# Patient Record
Sex: Female | Born: 1950 | Race: White | Hispanic: No | Marital: Married | Smoking: Former smoker
Health system: Southern US, Community
[De-identification: ages and names within clinical notes are randomized; demographics above are authoritative.]

## PROBLEM LIST (undated history)

## (undated) DIAGNOSIS — F419 Anxiety disorder, unspecified: Secondary | ICD-10-CM

## (undated) DIAGNOSIS — E785 Hyperlipidemia, unspecified: Secondary | ICD-10-CM

## (undated) DIAGNOSIS — I10 Essential (primary) hypertension: Secondary | ICD-10-CM

---

## 1992-12-30 HISTORY — PX: REDUCTION MAMMAPLASTY: SUR839

## 1998-09-12 ENCOUNTER — Ambulatory Visit (HOSPITAL_BASED_OUTPATIENT_CLINIC_OR_DEPARTMENT_OTHER): Admission: RE | Admit: 1998-09-12 | Discharge: 1998-09-12 | Payer: Self-pay | Admitting: General Surgery

## 1999-10-10 ENCOUNTER — Encounter: Payer: Self-pay | Admitting: Family Medicine

## 1999-10-10 ENCOUNTER — Ambulatory Visit (HOSPITAL_COMMUNITY): Admission: RE | Admit: 1999-10-10 | Discharge: 1999-10-10 | Payer: Self-pay | Admitting: Family Medicine

## 2001-09-18 ENCOUNTER — Ambulatory Visit (HOSPITAL_COMMUNITY): Admission: RE | Admit: 2001-09-18 | Discharge: 2001-09-18 | Payer: Self-pay | Admitting: Neurosurgery

## 2001-09-18 ENCOUNTER — Encounter: Payer: Self-pay | Admitting: Neurosurgery

## 2004-11-13 ENCOUNTER — Ambulatory Visit: Payer: Self-pay | Admitting: Family Medicine

## 2005-12-25 ENCOUNTER — Ambulatory Visit: Payer: Self-pay | Admitting: Family Medicine

## 2007-02-24 ENCOUNTER — Ambulatory Visit: Payer: Self-pay | Admitting: Family Medicine

## 2008-04-28 ENCOUNTER — Ambulatory Visit: Payer: Self-pay | Admitting: Family Medicine

## 2009-05-09 ENCOUNTER — Ambulatory Visit: Payer: Self-pay | Admitting: Family Medicine

## 2009-07-10 ENCOUNTER — Ambulatory Visit: Payer: Self-pay | Admitting: Gastroenterology

## 2011-07-12 ENCOUNTER — Emergency Department: Payer: Self-pay | Admitting: Emergency Medicine

## 2011-08-01 ENCOUNTER — Ambulatory Visit: Payer: Self-pay | Admitting: Physical Medicine and Rehabilitation

## 2014-07-20 ENCOUNTER — Ambulatory Visit: Payer: Self-pay | Admitting: Family Medicine

## 2015-05-25 ENCOUNTER — Other Ambulatory Visit: Payer: Self-pay | Admitting: Family Medicine

## 2015-05-25 DIAGNOSIS — Z1231 Encounter for screening mammogram for malignant neoplasm of breast: Secondary | ICD-10-CM

## 2015-07-24 ENCOUNTER — Ambulatory Visit: Payer: Self-pay | Attending: Family Medicine

## 2016-06-14 ENCOUNTER — Ambulatory Visit: Payer: Self-pay

## 2016-06-19 ENCOUNTER — Ambulatory Visit: Payer: Self-pay | Attending: Family Medicine

## 2016-07-18 ENCOUNTER — Other Ambulatory Visit: Payer: Self-pay | Admitting: Family Medicine

## 2016-07-18 ENCOUNTER — Ambulatory Visit
Admission: RE | Admit: 2016-07-18 | Discharge: 2016-07-18 | Disposition: A | Discharge status: Home / Self Care | Payer: 59 | Source: Ambulatory Visit | Attending: Family Medicine | Admitting: Family Medicine

## 2016-07-18 DIAGNOSIS — Z1231 Encounter for screening mammogram for malignant neoplasm of breast: Secondary | ICD-10-CM | POA: Insufficient documentation

## 2017-12-01 ENCOUNTER — Other Ambulatory Visit: Payer: Self-pay | Admitting: Gynecologic Oncology

## 2018-07-23 ENCOUNTER — Emergency Department
Admission: EM | Admit: 2018-07-23 | Discharge: 2018-07-23 | Disposition: A | Discharge status: Home / Self Care | Payer: Medicare HMO | Attending: Emergency Medicine | Admitting: Emergency Medicine

## 2018-07-23 ENCOUNTER — Other Ambulatory Visit: Payer: Self-pay

## 2018-07-23 DIAGNOSIS — I10 Essential (primary) hypertension: Secondary | ICD-10-CM | POA: Insufficient documentation

## 2018-07-23 DIAGNOSIS — R9431 Abnormal electrocardiogram [ECG] [EKG]: Secondary | ICD-10-CM

## 2018-07-23 DIAGNOSIS — E876 Hypokalemia: Secondary | ICD-10-CM | POA: Insufficient documentation

## 2018-07-23 DIAGNOSIS — R110 Nausea: Secondary | ICD-10-CM

## 2018-07-23 HISTORY — DX: Essential (primary) hypertension: I10

## 2018-07-23 HISTORY — DX: Hyperlipidemia, unspecified: E78.5

## 2018-07-23 LAB — COMPREHENSIVE METABOLIC PANEL
ALBUMIN: 4.5 g/dL (ref 3.5–5.0)
ALT: 16 U/L (ref 0–44)
ANION GAP: 12 (ref 5–15)
AST: 24 U/L (ref 15–41)
Alkaline Phosphatase: 67 U/L (ref 38–126)
BUN: 13 mg/dL (ref 8–23)
CHLORIDE: 100 mmol/L (ref 98–111)
CO2: 29 mmol/L (ref 22–32)
Calcium: 9.5 mg/dL (ref 8.9–10.3)
Creatinine, Ser: 0.69 mg/dL (ref 0.44–1.00)
GFR calc Af Amer: >60 mL/min (ref >60)
GLUCOSE: 167 mg/dL — AB (ref 70–99)
POTASSIUM: 2.5 mmol/L — AB (ref 3.5–5.1)
Sodium: 141 mmol/L (ref 135–145)
Total Bilirubin: 0.6 mg/dL (ref 0.3–1.2)
Total Protein: 8.1 g/dL (ref 6.5–8.1)

## 2018-07-23 LAB — CBC
HEMATOCRIT: 41.9 % (ref 35.0–47.0)
HEMOGLOBIN: 14.8 g/dL (ref 12.0–16.0)
MCH: 32 pg (ref 26.0–34.0)
MCHC: 35.3 g/dL (ref 32.0–36.0)
MCV: 90.7 fL (ref 80.0–100.0)
Platelets: 354 10*3/uL (ref 150–440)
RBC: 4.62 MIL/uL (ref 3.80–5.20)
RDW: 13.6 % (ref 11.5–14.5)
WBC: 12.7 10*3/uL — AB (ref 3.6–11.0)

## 2018-07-23 LAB — LIPASE, BLOOD: LIPASE: 35 U/L (ref 11–51)

## 2018-07-23 LAB — TROPONIN I: Troponin I: <0.03 ng/mL (ref <0.03)

## 2018-07-23 LAB — CK: Total CK: 100 U/L (ref 38–234)

## 2018-07-23 MED ORDER — ONDANSETRON HCL 4 MG/2ML IJ SOLN
4.0000 mg | Freq: Once | INTRAMUSCULAR | Status: AC
  Administered 2018-07-23: 4 mg via INTRAVENOUS
  Filled 2018-07-23: qty 2

## 2018-07-23 MED ORDER — POTASSIUM CHLORIDE ER 10 MEQ PO TBCR: 40.0000 meq | EXTENDED_RELEASE_TABLET | Freq: Every day | ORAL | 0 refills | Status: DC

## 2018-07-23 MED ORDER — POTASSIUM CHLORIDE CRYS ER 20 MEQ PO TBCR
40.0000 meq | EXTENDED_RELEASE_TABLET | Freq: Once | ORAL | Status: AC
  Administered 2018-07-23: 40 meq via ORAL
  Filled 2018-07-23: qty 2

## 2018-07-23 MED ORDER — MAGNESIUM SULFATE 2 GM/50ML IV SOLN
2.0000 g | Freq: Once | INTRAVENOUS | Status: AC
  Administered 2018-07-23: 2 g via INTRAVENOUS
  Filled 2018-07-23: qty 50

## 2018-07-23 MED ORDER — SODIUM CHLORIDE 0.9 % IV BOLUS
1000.0000 mL | Freq: Once | INTRAVENOUS | Status: AC
  Administered 2018-07-23: 1000 mL via INTRAVENOUS

## 2018-07-23 NOTE — ED Notes (Signed)

## 2018-07-23 NOTE — ED Triage Notes (Signed)
First Nurse Note:  C/O N/V today.  Patient is concerned she is dehydrated.  AAOx3.  Skin warm and dry.  MM Moist.

## 2018-07-23 NOTE — ED Notes (Signed)
Writer attempted to draw blood. Pt would not be still, once needle was in pt jumped up and stated "I need to be in a bed when I give blood." pt is now in main waiting room.

## 2018-07-23 NOTE — ED Provider Notes (Signed)
[facility] Emergency Department Provider Note ____________________________________________   First MD Initiated Contact with Patient 07/23/18 1933     (approximate)  I have reviewed the triage vital signs and the nursing notes.   HISTORY  Chief Complaint Emesis  HPI Aldona LentoBrenda C Sessa is a 67 y.o. female with a history of hypertension hyperlipidemia was presenting with diffuse body aches as well as nausea over the past 12 hours.  She says that she has been working outside frequently lately in the heat.  Denies any chest pain or abdominal pain.  Past Medical History:  Diagnosis Date  . Hyperlipidemia   . Hypertension     There are no active problems to display for this patient.   Past Surgical History:  Procedure Laterality Date  . REDUCTION MAMMAPLASTY Bilateral 1994    Prior to Admission medications   Not on File    Allergies Patient has no known allergies.  Family History  Problem Relation Age of Onset  . Breast cancer Paternal Grandmother     Social History Social History   Tobacco Use  . Smoking status: Never Smoker  Substance Use Topics  . Alcohol use: Never    Frequency: Never  . Drug use: Not on file    Review of Systems  Constitutional: No fever/chills Eyes: No visual changes. ENT: No sore throat. Cardiovascular: Denies chest pain. Respiratory: Denies shortness of breath. Gastrointestinal: No abdominal pain.  no vomiting.  No diarrhea.  No constipation. Genitourinary: Negative for dysuria. Musculoskeletal: The aches as above Skin: Negative for rash. Neurological: Negative for headaches, focal weakness or numbness.   ____________________________________________   PHYSICAL EXAM:  VITAL SIGNS: ED Triage Vitals  Enc Vitals Group     BP 07/23/18 1856 109/68     Pulse Rate 07/23/18 1856 78     Resp 07/23/18 1856 19     Temp 07/23/18 1856 98 F (36.7 C)     Temp Source 07/23/18 1856 Oral     SpO2 07/23/18 1856 98  %     Weight --      Height --      Head Circumference --      Peak Flow --      Pain Score 07/23/18 1857 10     Pain Loc --      Pain Edu? --      Excl. in GC? --     Constitutional: Alert and oriented. Well appearing and in no acute distress. Eyes: Conjunctivae are normal.  Head: Atraumatic. Nose: No congestion/rhinnorhea. Mouth/Throat: Mucous membranes are moist.  Neck: No stridor.   Cardiovascular: Normal rate, regular rhythm. Grossly normal heart sounds.  Respiratory: Normal respiratory effort.  No retractions. Lungs CTAB. Gastrointestinal: Soft and nontender. No distention.  Musculoskeletal: No lower extremity tenderness nor edema.  No joint effusions. Neurologic:  Normal speech and language. No gross focal neurologic deficits are appreciated. Skin:  Skin is warm, dry and intact. No rash noted. Psychiatric: Mood and affect are normal. Speech and behavior are normal.  ____________________________________________   LABS (all labs ordered are listed, but only abnormal results are displayed)  Labs Reviewed  COMPREHENSIVE METABOLIC PANEL - Abnormal; Notable for the following components:      Result Value   Potassium 2.5 (*)    Glucose, Bld 167 (*)    All other components within normal limits  CBC - Abnormal; Notable for the following components:   WBC 12.7 (*)    All other components within normal limits  LIPASE, BLOOD  CK  TROPONIN I  URINALYSIS, COMPLETE (UACMP) WITH MICROSCOPIC   ____________________________________________  EKG  ED ECG REPORT I, Arelia Longest, the attending physician, personally viewed and interpreted this ECG.   Date: 07/23/2018  EKG Time: 2011  Rate: 81  Rhythm: Sinus arrhythmia.  PACs present  Axis: Prolonged QT interval with a QTC of 546  Intervals:none  ST&T Change: No ST segment elevation or depression.  No abnormal T wave inversion.  ED ECG REPORT I, Arelia Longest, the attending physician, personally viewed and  interpreted this ECG.   Date: 07/23/2018  EKG Time: 2157  Rate: 83  Rhythm: normal sinus rhythm  Axis: Normal  Intervals:Prolonged QTC at 507  ST&T Change: No ST segment elevation or depression.  No abnormal T wave inversion.  ____________________________________________  RADIOLOGY   ____________________________________________   PROCEDURES  Procedure(s) performed:   Procedures  Critical Care performed:   ____________________________________________   INITIAL IMPRESSION / ASSESSMENT AND PLAN / ED COURSE  Pertinent labs & imaging results that were available during my care of the patient were reviewed by me and considered in my medical decision making (see chart for details).  DDX: Rhabdomyolysis, ACS, viral syndrome, heat exhaustion, heat cramps, electrolyte abnormality, prolonged QTC, hypomagnesemia, As part of my medical decision making, I reviewed the following data within the electronic MEDICAL RECORD NUMBER Notes from prior outpatient visits  ----------------------------------------- 10:10 PM on 07/23/2018 -----------------------------------------  Patient feeling improved after fluids, magnesium as well as potassium.  QTC improved.  Patient tolerating fluids at this time.  Patient says that she feels improved overall.  Will be discharged home.  We will continue her on several doses of potassium and she will follow-up with cardiology given her initial prolonged QTC.  However, she denies any palpitations.  Did not report any near syncope.  QTC is improved greatly since the first EKG.  She will be discharged at this time. ____________________________________________   FINAL CLINICAL IMPRESSION(S) / ED DIAGNOSES  Nausea.  Prolonged QTC.  Hypokalemia.    NEW MEDICATIONS STARTED DURING THIS VISIT:  New Prescriptions   No medications on file     Note:  This document was prepared using Dragon voice recognition software and may include unintentional dictation  errors.     Myrna Blazer, MD 07/23/18 [phone]

## 2018-07-23 NOTE — ED Triage Notes (Signed)
Pt states N&V x few days. Here with husband. Husband states pt is dehydrated. Has been working outside a lot recently.

## 2018-07-23 NOTE — ED Notes (Signed)
Pt aware urine sample is needed, states she voided prior to coming back to room and is unable to at this time

## 2019-10-22 ENCOUNTER — Other Ambulatory Visit: Payer: Self-pay | Admitting: Family Medicine

## 2019-10-22 DIAGNOSIS — Z1231 Encounter for screening mammogram for malignant neoplasm of breast: Secondary | ICD-10-CM

## 2019-11-15 ENCOUNTER — Ambulatory Visit
Admission: RE | Admit: 2019-11-15 | Discharge: 2019-11-15 | Disposition: A | Discharge status: Home / Self Care | Payer: Medicare HMO | Source: Ambulatory Visit | Attending: Family Medicine | Admitting: Family Medicine

## 2019-11-15 DIAGNOSIS — Z1231 Encounter for screening mammogram for malignant neoplasm of breast: Secondary | ICD-10-CM | POA: Insufficient documentation

## 2019-11-17 ENCOUNTER — Other Ambulatory Visit: Payer: Self-pay | Admitting: Student

## 2019-11-17 DIAGNOSIS — M7521 Bicipital tendinitis, right shoulder: Secondary | ICD-10-CM

## 2019-11-17 DIAGNOSIS — M7581 Other shoulder lesions, right shoulder: Secondary | ICD-10-CM

## 2019-11-17 DIAGNOSIS — G8929 Other chronic pain: Secondary | ICD-10-CM

## 2019-11-28 ENCOUNTER — Ambulatory Visit
Admission: RE | Admit: 2019-11-28 | Discharge: 2019-11-28 | Disposition: A | Discharge status: Home / Self Care | Payer: Medicare HMO | Source: Ambulatory Visit | Attending: Student | Admitting: Student

## 2019-11-28 DIAGNOSIS — M7521 Bicipital tendinitis, right shoulder: Secondary | ICD-10-CM | POA: Diagnosis present

## 2019-11-28 DIAGNOSIS — M25511 Pain in right shoulder: Secondary | ICD-10-CM | POA: Diagnosis not present

## 2019-11-28 DIAGNOSIS — M7581 Other shoulder lesions, right shoulder: Secondary | ICD-10-CM | POA: Diagnosis present

## 2019-11-28 DIAGNOSIS — G8929 Other chronic pain: Secondary | ICD-10-CM | POA: Insufficient documentation

## 2019-12-06 ENCOUNTER — Other Ambulatory Visit: Payer: Self-pay

## 2019-12-06 DIAGNOSIS — Z20822 Contact with and (suspected) exposure to covid-19: Secondary | ICD-10-CM

## 2019-12-10 DIAGNOSIS — M75121 Complete rotator cuff tear or rupture of right shoulder, not specified as traumatic: Secondary | ICD-10-CM | POA: Insufficient documentation

## 2020-09-05 ENCOUNTER — Other Ambulatory Visit: Payer: Self-pay | Admitting: Surgery

## 2020-09-07 ENCOUNTER — Encounter
Admission: RE | Admit: 2020-09-07 | Discharge: 2020-09-07 | Disposition: A | Discharge status: Home / Self Care | Payer: Medicare HMO | Source: Ambulatory Visit | Attending: Surgery | Admitting: Surgery

## 2020-09-07 ENCOUNTER — Other Ambulatory Visit: Payer: Self-pay

## 2020-09-07 HISTORY — DX: Anxiety disorder, unspecified: F41.9

## 2020-09-07 NOTE — Patient Instructions (Signed)
Your procedure is scheduled on: 09/14/20 Report to [facility]. To find out your arrival time please call [phone] between 1PM - 3PM on 09/13/20.  Remember: Instructions that are not followed completely may result in serious medical risk, up to and including death, or upon the discretion of your surgeon and anesthesiologist your surgery may need to be rescheduled.     _X__ 1. Do not eat food after midnight the night before your procedure.                 No gum chewing or hard candies. You may drink clear liquids up to 2 hours                 before you are scheduled to arrive for your surgery- DO not drink clear                 liquids within 2 hours of the start of your surgery.                 Clear Liquids include:  water, apple juice without pulp, clear carbohydrate                 drink such as Clearfast or Gatorade, Black Coffee or Tea (Do not add                 anything to coffee or tea). Diabetics water only  __X__2.  On the morning of surgery brush your teeth with toothpaste and water, you                 may rinse your mouth with mouthwash if you wish.  Do not swallow any              toothpaste of mouthwash.     _X__ 3.  No Alcohol for 24 hours before or after surgery.   _X__ 4.  Do Not Smoke or use e-cigarettes For 24 Hours Prior to Your Surgery.                 Do not use any chewable tobacco products for at least 6 hours prior to                 surgery.  ____  5.  Bring all medications with you on the day of surgery if instructed.   __X__  6.  Notify your doctor if there is any change in your medical condition      (cold, fever, infections).     Do not wear jewelry, make-up, hairpins, clips or nail polish. Do not wear lotions, powders, or perfumes.  Do not shave 48 hours prior to surgery. Men may shave face and neck. Do not bring valuables to the hospital.    [facility] is not responsible for any belongings or  valuables.  Contacts, dentures/partials or body piercings may not be worn into surgery. Bring a case for your contacts, glasses or hearing aids, a denture cup will be supplied. Leave your suitcase in the car. After surgery it may be brought to your room. For patients admitted to the hospital, discharge time is determined by your treatment team.   Patients discharged the day of surgery will not be allowed to drive home.   Please read over the following fact sheets that you were given:   MRSA Information  __X__ Take these medicines the morning of surgery with A SIP OF WATER:  1. amLODipine (NORVASC) 5 MG tablet  2. FLUoxetine (PROZAC) 20 MG capsule  3. simvastatin (ZOCOR) 40 MG tablet  4.  5.  6.  ____ Fleet Enema (as directed)   __X__ Use CHG Soap/SAGE wipes as directed  ____ Use inhalers on the day of surgery  ____ Stop metformin/Janumet/Farxiga 2 days prior to surgery    ____ Take 1/2 of usual insulin dose the night before surgery. No insulin the morning          of surgery.   ____ Stop Blood Thinners Coumadin/Plavix/Xarelto/Pleta/Pradaxa/Eliquis/Effient/Aspirin  on   Or contact your Surgeon, Cardiologist or Medical Doctor regarding  ability to stop your blood thinners  __X__ Stop Anti-inflammatories 7 days before surgery such as Advil, Ibuprofen, Motrin,  BC or Goodies Powder, Naprosyn, Naproxen, Aleve, Aspirin    __X__ Stop all herbal supplements, fish oil or vitamin E until after surgery.    ____ Bring C-Pap to the hospital.     How to Use Chlorhexidine for Bathing Chlorhexidine gluconate (CHG) is a germ-killing (antiseptic) solution that is used to clean the skin. It can get rid of the bacteria that normally live on the skin and can keep them away for about 24 hours. To clean your skin with CHG, you may be given:  A CHG solution to use in the shower or as part of a sponge bath.  A prepackaged cloth that contains CHG. Cleaning your skin with CHG may help lower  the risk for infection:  While you are staying in the intensive care unit of the hospital.  If you have a vascular access, such as a central line, to provide short-term or long-term access to your veins.  If you have a catheter to drain urine from your bladder.  If you are on a ventilator. A ventilator is a machine that helps you breathe by moving air in and out of your lungs.  After surgery. What are the risks? Risks of using CHG include:  A skin reaction.  Hearing loss, if CHG gets in your ears.  Eye injury, if CHG gets in your eyes and is not rinsed out.  The CHG product catching fire. Make sure that you avoid smoking and flames after applying CHG to your skin. Do not use CHG:  If you have a chlorhexidine allergy or have previously reacted to chlorhexidine.  On babies younger than 6 months of age. How to use CHG solution  Use CHG only as told by your health care provider, and follow the instructions on the label.  Use the full amount of CHG as directed. Usually, this is one bottle. During a shower Follow these steps when using CHG solution during a shower (unless your health care provider gives you different instructions): 1. Start the shower. 2. Use your normal soap and shampoo to wash your face and hair. 3. Turn off the shower or move out of the shower stream. 4. Pour the CHG onto a clean washcloth. Do not use any type of brush or rough-edged sponge. 5. Starting at your neck, lather your body down to your toes. Make sure you follow these instructions: ? If you will be having surgery, pay special attention to the part of your body where you will be having surgery. Scrub this area for at least 1 minute. ? Do not use CHG on your head or face. If the solution gets into your ears or eyes, rinse them well with water. ? Avoid your genital area. ? Avoid any areas of skin  that have broken skin, cuts, or scrapes. ? Scrub your back and under your arms. Make sure to wash skin  folds. 6. Let the lather sit on your skin for 1-2 minutes or as long as told by your health care provider. 7. Thoroughly rinse your entire body in the shower. Make sure that all body creases and crevices are rinsed well. 8. Dry off with a clean towel. Do not put any substances on your body afterward--such as powder, lotion, or perfume--unless you are told to do so by your health care provider. Only use lotions that are recommended by the manufacturer. 9. Put on clean clothes or pajamas. 10. If it is the night before your surgery, sleep in clean sheets. 11. REPEAT THE MORNING OF SURGERY (USE 1/2 BOTTLE FOR EACH SHOWER)    How to Use an Incentive Spirometer An incentive spirometer is a tool that measures how well you are filling your lungs with each breath. Learning to take long, deep breaths using this tool can help you keep your lungs clear and active. This may help to reverse or lessen your chance of developing breathing (pulmonary) problems, especially infection. You may be asked to use a spirometer:  After a surgery.  If you have a lung problem or a history of smoking.  After a long period of time when you have been unable to move or be active. If the spirometer includes an indicator to show the highest number that you have reached, your health care provider or respiratory therapist will help you set a goal. Keep a list (log) of your progress as told by your health care provider. What are the risks?  Breathing too quickly may cause dizziness or cause you to pass out. Take your time so you do not get dizzy or light-headed.  If you are in pain, you may need to take pain medicine before doing incentive spirometry. It is harder to take a deep breath if you are having pain. How to use your incentive spirometer  1. Sit up on the edge of your bed or on a chair. 2. Hold the incentive spirometer so that it is in an upright position. 3. Before you use the spirometer, breathe out  normally. 4. Place the mouthpiece in your mouth. Make sure your lips are closed tightly around it. 5. Breathe in slowly and as deeply as you can through your mouth, causing the piston or the ball to rise toward the top of the chamber. 6. Hold your breath for 3-5 seconds, or for as long as possible. ? If the spirometer includes a coach indicator, use this to guide you in breathing. Slow down your breathing if the indicator goes above the marked areas. 7. Remove the mouthpiece from your mouth and breathe out normally. The piston or ball will return to the bottom of the chamber. 8. Rest for a few seconds, then repeat the steps 10 or more times. ? Take your time and take a few normal breaths between deep breaths so that you do not get dizzy or light-headed. ? Do this every 1-2 hours when you are awake. 9. If the spirometer includes a goal marker to show the highest number you have reached (best effort), use this as a goal to work toward during each repetition. 10. After each set of 10 deep breaths, cough a few times. This will help to make sure that your lungs are clear. ? If you have an incision on your chest or abdomen from surgery, place a  pillow or a rolled-up towel firmly against the incision when you cough. This can help to reduce pain from coughing. General tips  When you become able to get out of bed, walk around often and continue to cough to help clear your lungs.  Keep using the incentive spirometer until your health care provider says it is okay to stop using it. If you have been in the hospital, you may be told to keep using the spirometer at home. Contact a health care provider if:  You are having difficulty using the spirometer.  You have trouble using the spirometer as often as instructed.  Your pain medicine is not giving enough relief for you to use the spirometer as told.  You have a fever.  You develop shortness of breath. Get help right away if:  You develop a cough  with bloody mucus from the lungs (bloody sputum).  You have fluid or blood coming from an incision site after you cough. Summary  An incentive spirometer is a tool that can help you learn to take long, deep breaths to keep your lungs clear and active.  You may be asked to use a spirometer after a surgery, if you have a lung problem or a history of smoking, or if you have been inactive for a long period of time.  Use your incentive spirometer as instructed every 1-2 hours while you are awake.  If you have an incision on your chest or abdomen, place a pillow or a rolled-up towel firmly against your incision when you cough. This will help to reduce pain. This information is not intended to replace advice given to you by your health care provider. Make sure you discuss any questions you have with your health care provider. Document Revised: 07/16/2019 Document Reviewed: 10/29/2017 Elsevier Patient Education  2020 [employer].

## 2020-09-08 ENCOUNTER — Encounter
Admission: RE | Admit: 2020-09-08 | Discharge: 2020-09-08 | Disposition: A | Discharge status: Home / Self Care | Payer: Medicare HMO | Source: Ambulatory Visit | Attending: Surgery | Admitting: Surgery

## 2020-09-08 DIAGNOSIS — E785 Hyperlipidemia, unspecified: Secondary | ICD-10-CM | POA: Diagnosis not present

## 2020-09-08 DIAGNOSIS — I10 Essential (primary) hypertension: Secondary | ICD-10-CM | POA: Diagnosis not present

## 2020-09-08 DIAGNOSIS — Z01810 Encounter for preprocedural cardiovascular examination: Secondary | ICD-10-CM | POA: Insufficient documentation

## 2020-09-12 ENCOUNTER — Other Ambulatory Visit
Admission: RE | Admit: 2020-09-12 | Discharge: 2020-09-12 | Disposition: A | Discharge status: Home / Self Care | Payer: Medicare HMO | Source: Ambulatory Visit | Attending: Surgery | Admitting: Surgery

## 2020-09-12 ENCOUNTER — Other Ambulatory Visit: Payer: Self-pay

## 2020-09-12 DIAGNOSIS — Z20822 Contact with and (suspected) exposure to covid-19: Secondary | ICD-10-CM | POA: Insufficient documentation

## 2020-09-12 DIAGNOSIS — Z01812 Encounter for preprocedural laboratory examination: Secondary | ICD-10-CM | POA: Insufficient documentation

## 2020-09-12 LAB — SARS CORONAVIRUS 2 (TAT 6-24 HRS): SARS Coronavirus 2: NEGATIVE

## 2020-09-14 ENCOUNTER — Ambulatory Visit: Payer: Medicare HMO | Admitting: Certified Registered"

## 2020-09-14 ENCOUNTER — Encounter
Admission: RE | Disposition: A | Discharge status: Home / Self Care | Payer: Self-pay | Source: Home / Self Care | Attending: Surgery

## 2020-09-14 ENCOUNTER — Ambulatory Visit: Payer: Medicare HMO

## 2020-09-14 ENCOUNTER — Encounter: Payer: Self-pay | Admitting: Surgery

## 2020-09-14 ENCOUNTER — Other Ambulatory Visit: Payer: Self-pay

## 2020-09-14 ENCOUNTER — Ambulatory Visit
Admission: RE | Admit: 2020-09-14 | Discharge: 2020-09-14 | Disposition: A | Discharge status: Home / Self Care | Payer: Medicare HMO | Attending: Surgery | Admitting: Surgery

## 2020-09-14 DIAGNOSIS — Z79899 Other long term (current) drug therapy: Secondary | ICD-10-CM | POA: Insufficient documentation

## 2020-09-14 DIAGNOSIS — Z809 Family history of malignant neoplasm, unspecified: Secondary | ICD-10-CM | POA: Insufficient documentation

## 2020-09-14 DIAGNOSIS — Z87891 Personal history of nicotine dependence: Secondary | ICD-10-CM | POA: Insufficient documentation

## 2020-09-14 DIAGNOSIS — Z820 Family history of epilepsy and other diseases of the nervous system: Secondary | ICD-10-CM | POA: Insufficient documentation

## 2020-09-14 DIAGNOSIS — Z8249 Family history of ischemic heart disease and other diseases of the circulatory system: Secondary | ICD-10-CM | POA: Diagnosis not present

## 2020-09-14 DIAGNOSIS — M75121 Complete rotator cuff tear or rupture of right shoulder, not specified as traumatic: Secondary | ICD-10-CM | POA: Diagnosis not present

## 2020-09-14 DIAGNOSIS — M25511 Pain in right shoulder: Secondary | ICD-10-CM

## 2020-09-14 DIAGNOSIS — Z86010 Personal history of colonic polyps: Secondary | ICD-10-CM | POA: Insufficient documentation

## 2020-09-14 DIAGNOSIS — M24111 Other articular cartilage disorders, right shoulder: Secondary | ICD-10-CM | POA: Insufficient documentation

## 2020-09-14 DIAGNOSIS — M75101 Unspecified rotator cuff tear or rupture of right shoulder, not specified as traumatic: Secondary | ICD-10-CM | POA: Diagnosis present

## 2020-09-14 DIAGNOSIS — I10 Essential (primary) hypertension: Secondary | ICD-10-CM | POA: Insufficient documentation

## 2020-09-14 DIAGNOSIS — F329 Major depressive disorder, single episode, unspecified: Secondary | ICD-10-CM | POA: Diagnosis not present

## 2020-09-14 DIAGNOSIS — K589 Irritable bowel syndrome without diarrhea: Secondary | ICD-10-CM | POA: Diagnosis not present

## 2020-09-14 DIAGNOSIS — M7581 Other shoulder lesions, right shoulder: Secondary | ICD-10-CM | POA: Diagnosis not present

## 2020-09-14 DIAGNOSIS — E785 Hyperlipidemia, unspecified: Secondary | ICD-10-CM | POA: Insufficient documentation

## 2020-09-14 DIAGNOSIS — M7541 Impingement syndrome of right shoulder: Secondary | ICD-10-CM | POA: Diagnosis not present

## 2020-09-14 DIAGNOSIS — F419 Anxiety disorder, unspecified: Secondary | ICD-10-CM | POA: Insufficient documentation

## 2020-09-14 DIAGNOSIS — M19011 Primary osteoarthritis, right shoulder: Secondary | ICD-10-CM | POA: Insufficient documentation

## 2020-09-14 HISTORY — PX: SHOULDER ARTHROSCOPY WITH OPEN ROTATOR CUFF REPAIR: SHX6092

## 2020-09-14 LAB — URINE DRUG SCREEN, QUALITATIVE (ARMC ONLY)
Amphetamines, Ur Screen: NOT DETECTED
Barbiturates, Ur Screen: NOT DETECTED
Benzodiazepine, Ur Scrn: NOT DETECTED
Cannabinoid 50 Ng, Ur ~~LOC~~: POSITIVE — AB
Cocaine Metabolite,Ur ~~LOC~~: NOT DETECTED
MDMA (Ecstasy)Ur Screen: NOT DETECTED
Methadone Scn, Ur: NOT DETECTED
Opiate, Ur Screen: NOT DETECTED
Phencyclidine (PCP) Ur S: NOT DETECTED
Tricyclic, Ur Screen: NOT DETECTED

## 2020-09-14 SURGERY — ARTHROSCOPY, SHOULDER WITH REPAIR, ROTATOR CUFF, OPEN
Anesthesia: General | Site: Shoulder | Laterality: Right

## 2020-09-14 MED ORDER — BUPIVACAINE LIPOSOME 1.3 % IJ SUSP
INTRAMUSCULAR | Status: DC | PRN
  Administered 2020-09-14: 15 mL via PERINEURAL

## 2020-09-14 MED ORDER — GLYCOPYRROLATE 0.2 MG/ML IJ SOLN
INTRAMUSCULAR | Status: DC | PRN
  Administered 2020-09-14: .2 mg via INTRAVENOUS

## 2020-09-14 MED ORDER — LACTATED RINGERS IV SOLN
INTRAVENOUS | Status: DC | PRN
  Administered 2020-09-14: 3000 mL

## 2020-09-14 MED ORDER — ACETAMINOPHEN 160 MG/5ML PO SOLN
325.0000 mg | ORAL | Status: DC | PRN
  Filled 2020-09-14: qty 20.3

## 2020-09-14 MED ORDER — BUPIVACAINE LIPOSOME 1.3 % IJ SUSP
INTRAMUSCULAR | Status: AC
  Filled 2020-09-14: qty 20

## 2020-09-14 MED ORDER — ONDANSETRON HCL 4 MG/2ML IJ SOLN
INTRAMUSCULAR | Status: DC | PRN
  Administered 2020-09-14: 4 mg via INTRAVENOUS

## 2020-09-14 MED ORDER — EPINEPHRINE PF 1 MG/ML IJ SOLN
INTRAMUSCULAR | Status: AC
  Filled 2020-09-14: qty 4

## 2020-09-14 MED ORDER — PROPOFOL 10 MG/ML IV BOLUS
INTRAVENOUS | Status: AC
  Filled 2020-09-14: qty 40

## 2020-09-14 MED ORDER — OXYCODONE HCL 5 MG PO TABS
5.0000 mg | ORAL_TABLET | ORAL | Status: DC | PRN
  Filled 2020-09-14: qty 2

## 2020-09-14 MED ORDER — EPHEDRINE SULFATE 50 MG/ML IJ SOLN
INTRAMUSCULAR | Status: DC | PRN
  Administered 2020-09-14: 5 mg via INTRAVENOUS
  Administered 2020-09-14: 10 mg via INTRAVENOUS

## 2020-09-14 MED ORDER — ONDANSETRON HCL 4 MG PO TABS: 4.0000 mg | ORAL_TABLET | Freq: Four times a day (QID) | ORAL | Status: DC | PRN

## 2020-09-14 MED ORDER — DEXAMETHASONE SODIUM PHOSPHATE 10 MG/ML IJ SOLN
INTRAMUSCULAR | Status: AC
  Filled 2020-09-14: qty 1

## 2020-09-14 MED ORDER — ONDANSETRON HCL 4 MG/2ML IJ SOLN: 4.0000 mg | Freq: Four times a day (QID) | INTRAMUSCULAR | Status: DC | PRN

## 2020-09-14 MED ORDER — BUPIVACAINE HCL (PF) 0.5 % IJ SOLN
INTRAMUSCULAR | Status: AC
  Filled 2020-09-14: qty 10

## 2020-09-14 MED ORDER — HYDROCODONE-ACETAMINOPHEN 7.5-325 MG PO TABS: 1.0000 | ORAL_TABLET | Freq: Once | ORAL | Status: DC | PRN

## 2020-09-14 MED ORDER — DEXAMETHASONE SODIUM PHOSPHATE 10 MG/ML IJ SOLN
INTRAMUSCULAR | Status: DC | PRN
  Administered 2020-09-14: 8 mg via INTRAVENOUS

## 2020-09-14 MED ORDER — CEFAZOLIN SODIUM-DEXTROSE 2-4 GM/100ML-% IV SOLN
INTRAVENOUS | Status: AC
  Filled 2020-09-14: qty 100

## 2020-09-14 MED ORDER — BUPIVACAINE HCL (PF) 0.5 % IJ SOLN
INTRAMUSCULAR | Status: DC | PRN
  Administered 2020-09-14: 5 mL via PERINEURAL

## 2020-09-14 MED ORDER — ROCURONIUM BROMIDE 100 MG/10ML IV SOLN
INTRAVENOUS | Status: DC | PRN
  Administered 2020-09-14: 10 mg via INTRAVENOUS
  Administered 2020-09-14: 35 mg via INTRAVENOUS
  Administered 2020-09-14: 5 mg via INTRAVENOUS

## 2020-09-14 MED ORDER — FAMOTIDINE 20 MG PO TABS
ORAL_TABLET | ORAL | Status: AC
  Administered 2020-09-14: 20 mg via ORAL
  Filled 2020-09-14: qty 1

## 2020-09-14 MED ORDER — GLYCOPYRROLATE 0.2 MG/ML IJ SOLN
INTRAMUSCULAR | Status: AC
  Filled 2020-09-14: qty 1

## 2020-09-14 MED ORDER — FENTANYL CITRATE (PF) 100 MCG/2ML IJ SOLN: 50.0000 ug | Freq: Once | INTRAMUSCULAR | Status: AC

## 2020-09-14 MED ORDER — MIDAZOLAM HCL 2 MG/2ML IJ SOLN
INTRAMUSCULAR | Status: AC
  Filled 2020-09-14: qty 2

## 2020-09-14 MED ORDER — SUGAMMADEX SODIUM 200 MG/2ML IV SOLN
INTRAVENOUS | Status: DC | PRN
  Administered 2020-09-14: 200 mg via INTRAVENOUS

## 2020-09-14 MED ORDER — OXYCODONE HCL 5 MG PO TABS: 5.0000 mg | ORAL_TABLET | ORAL | 0 refills | Status: DC | PRN

## 2020-09-14 MED ORDER — SODIUM CHLORIDE 0.9 % IV SOLN
INTRAVENOUS | Status: DC | PRN
  Administered 2020-09-14: 16 ug/min via INTRAVENOUS

## 2020-09-14 MED ORDER — METOCLOPRAMIDE HCL 10 MG PO TABS: 5.0000 mg | ORAL_TABLET | Freq: Three times a day (TID) | ORAL | Status: DC | PRN

## 2020-09-14 MED ORDER — CHLORHEXIDINE GLUCONATE 0.12 % MT SOLN: 15.0000 mL | Freq: Once | OROMUCOSAL | Status: AC

## 2020-09-14 MED ORDER — FENTANYL CITRATE (PF) 100 MCG/2ML IJ SOLN
INTRAMUSCULAR | Status: AC
  Administered 2020-09-14: 50 ug via INTRAVENOUS
  Filled 2020-09-14: qty 2

## 2020-09-14 MED ORDER — MIDAZOLAM HCL 2 MG/2ML IJ SOLN
INTRAMUSCULAR | Status: DC | PRN
  Administered 2020-09-14 (×2): 1 mg via INTRAVENOUS

## 2020-09-14 MED ORDER — MIDAZOLAM HCL 2 MG/2ML IJ SOLN: 1.0000 mg | Freq: Once | INTRAMUSCULAR | Status: AC

## 2020-09-14 MED ORDER — PROMETHAZINE HCL 25 MG/ML IJ SOLN: 6.2500 mg | INTRAMUSCULAR | Status: DC | PRN

## 2020-09-14 MED ORDER — DEXMEDETOMIDINE (PRECEDEX) IN NS 20 MCG/5ML (4 MCG/ML) IV SYRINGE
PREFILLED_SYRINGE | INTRAVENOUS | Status: DC | PRN
  Administered 2020-09-14: 4 ug via INTRAVENOUS

## 2020-09-14 MED ORDER — SEVOFLURANE IN SOLN
RESPIRATORY_TRACT | Status: AC
  Filled 2020-09-14: qty 250

## 2020-09-14 MED ORDER — BUPIVACAINE-EPINEPHRINE 0.5% -1:200000 IJ SOLN
INTRAMUSCULAR | Status: DC | PRN
  Administered 2020-09-14: 30 mL

## 2020-09-14 MED ORDER — FENTANYL CITRATE (PF) 100 MCG/2ML IJ SOLN
INTRAMUSCULAR | Status: DC | PRN
Start: 2020-09-14 — End: 2020-09-14
  Administered 2020-09-14: 50 ug via INTRAVENOUS

## 2020-09-14 MED ORDER — PROPOFOL 10 MG/ML IV BOLUS
INTRAVENOUS | Status: DC | PRN
  Administered 2020-09-14: 25 ug/kg/min via INTRAVENOUS
  Administered 2020-09-14: 70 mg via INTRAVENOUS
  Administered 2020-09-14: 100 mg via INTRAVENOUS

## 2020-09-14 MED ORDER — METOCLOPRAMIDE HCL 5 MG/ML IJ SOLN: 5.0000 mg | Freq: Three times a day (TID) | INTRAMUSCULAR | Status: DC | PRN

## 2020-09-14 MED ORDER — ROCURONIUM BROMIDE 10 MG/ML (PF) SYRINGE
PREFILLED_SYRINGE | INTRAVENOUS | Status: AC
  Filled 2020-09-14: qty 10

## 2020-09-14 MED ORDER — FAMOTIDINE 20 MG PO TABS: 20.0000 mg | ORAL_TABLET | Freq: Once | ORAL | Status: AC

## 2020-09-14 MED ORDER — LACTATED RINGERS IV SOLN: INTRAVENOUS | Status: DC

## 2020-09-14 MED ORDER — CEFAZOLIN SODIUM-DEXTROSE 2-4 GM/100ML-% IV SOLN
2.0000 g | INTRAVENOUS | Status: AC
  Administered 2020-09-14: 2 g via INTRAVENOUS

## 2020-09-14 MED ORDER — CHLORHEXIDINE GLUCONATE 0.12 % MT SOLN
OROMUCOSAL | Status: AC
  Administered 2020-09-14: 15 mL via OROMUCOSAL
  Filled 2020-09-14: qty 15

## 2020-09-14 MED ORDER — DEXMEDETOMIDINE (PRECEDEX) IN NS 20 MCG/5ML (4 MCG/ML) IV SYRINGE
PREFILLED_SYRINGE | INTRAVENOUS | Status: AC
  Filled 2020-09-14: qty 5

## 2020-09-14 MED ORDER — MIDAZOLAM HCL 2 MG/2ML IJ SOLN
INTRAMUSCULAR | Status: AC
  Administered 2020-09-14: 1 mg via INTRAVENOUS
  Filled 2020-09-14: qty 2

## 2020-09-14 MED ORDER — ORAL CARE MOUTH RINSE: 15.0000 mL | Freq: Once | OROMUCOSAL | Status: AC

## 2020-09-14 MED ORDER — BUPIVACAINE HCL (PF) 0.5 % IJ SOLN
INTRAMUSCULAR | Status: AC
  Filled 2020-09-14: qty 30

## 2020-09-14 MED ORDER — DROPERIDOL 2.5 MG/ML IJ SOLN: 0.6250 mg | Freq: Once | INTRAMUSCULAR | Status: DC | PRN

## 2020-09-14 MED ORDER — ONDANSETRON HCL 4 MG/2ML IJ SOLN
INTRAMUSCULAR | Status: AC
  Filled 2020-09-14: qty 2

## 2020-09-14 MED ORDER — FENTANYL CITRATE (PF) 100 MCG/2ML IJ SOLN
INTRAMUSCULAR | Status: AC
  Filled 2020-09-14: qty 2

## 2020-09-14 MED ORDER — ACETAMINOPHEN 325 MG PO TABS: 325.0000 mg | ORAL_TABLET | ORAL | Status: DC | PRN

## 2020-09-14 SURGICAL SUPPLY — 58 items
ANCH SUT 2 2.9 2 LD TPR NDL (Anchor) ×2 IMPLANT
ANCH SUT 2 2/0 ABS BRD STRL (Anchor) ×1 IMPLANT
ANCH SUT 5.5 KNTLS (Anchor) ×1 IMPLANT
ANCHOR HEALICOIL REGEN 5.5 (Anchor) ×2 IMPLANT
ANCHOR JUGGERKNOT WTAP NDL 2.9 (Anchor) ×4 IMPLANT
ANCHOR SUT W/ ORTHOCORD (Anchor) ×2 IMPLANT
APL PRP STRL LF DISP 70% ISPRP (MISCELLANEOUS) ×1
BIT DRILL JUGRKNT W/NDL BIT2.9 (DRILL) IMPLANT
BLADE FULL RADIUS 3.5 (BLADE) ×3 IMPLANT
BUR ACROMIONIZER 4.0 (BURR) ×3 IMPLANT
CANNULA SHAVER 8MMX76MM (CANNULA) ×3 IMPLANT
CHLORAPREP W/TINT 26 (MISCELLANEOUS) ×3 IMPLANT
COVER MAYO STAND REUSABLE (DRAPES) ×3 IMPLANT
COVER WAND RF STERILE (DRAPES) ×3 IMPLANT
DILATOR 5.5 THREADED HEALICOIL (MISCELLANEOUS) ×2 IMPLANT
DRAPE IMP U-DRAPE 54X76 (DRAPES) ×6 IMPLANT
DRILL JUGGERKNOT W/NDL BIT 2.9 (DRILL) ×3
ELECT CAUTERY BLADE TIP 2.5 (TIP) ×3
ELECT REM PT RETURN 9FT ADLT (ELECTROSURGICAL) ×3
ELECTRODE CAUTERY BLDE TIP 2.5 (TIP) ×1 IMPLANT
ELECTRODE REM PT RTRN 9FT ADLT (ELECTROSURGICAL) ×1 IMPLANT
GAUZE SPONGE 4X4 12PLY STRL (GAUZE/BANDAGES/DRESSINGS) ×3 IMPLANT
GAUZE XEROFORM 1X8 LF (GAUZE/BANDAGES/DRESSINGS) ×3 IMPLANT
GLOVE BIO SURGEON STRL SZ 6.5 (GLOVE) ×1 IMPLANT
GLOVE BIO SURGEON STRL SZ7.5 (GLOVE) ×8 IMPLANT
GLOVE BIO SURGEON STRL SZ8 (GLOVE) ×6 IMPLANT
GLOVE BIO SURGEONS STRL SZ 6.5 (GLOVE) ×1
GLOVE BIOGEL PI IND STRL 7.0 (GLOVE) IMPLANT
GLOVE BIOGEL PI IND STRL 8 (GLOVE) ×1 IMPLANT
GLOVE BIOGEL PI INDICATOR 7.0 (GLOVE) ×2
GLOVE BIOGEL PI INDICATOR 8 (GLOVE) ×2
GLOVE INDICATOR 8.0 STRL GRN (GLOVE) ×3 IMPLANT
GOWN STRL REUS W/ TWL LRG LVL3 (GOWN DISPOSABLE) ×1 IMPLANT
GOWN STRL REUS W/ TWL XL LVL3 (GOWN DISPOSABLE) ×1 IMPLANT
GOWN STRL REUS W/TWL LRG LVL3 (GOWN DISPOSABLE) ×6
GOWN STRL REUS W/TWL XL LVL3 (GOWN DISPOSABLE) ×6
GRASPER SUT 15 45D LOW PRO (SUTURE) ×2 IMPLANT
IV LACTATED RINGER IRRG 3000ML (IV SOLUTION) ×6
IV LR IRRIG 3000ML ARTHROMATIC (IV SOLUTION) ×2 IMPLANT
KIT CANNULA 8X76-LX IN CANNULA (CANNULA) IMPLANT
MANIFOLD NEPTUNE II (INSTRUMENTS) ×3 IMPLANT
MASK FACE SPIDER DISP (MASK) ×3 IMPLANT
MAT ABSORB  FLUID 56X50 GRAY (MISCELLANEOUS) ×3
MAT ABSORB FLUID 56X50 GRAY (MISCELLANEOUS) ×1 IMPLANT
PACK SHDR ARTHRO (MISCELLANEOUS) ×3 IMPLANT
PASSER SUT FIRSTPASS SELF (INSTRUMENTS) IMPLANT
SLING ARM LRG DEEP (SOFTGOODS) ×3 IMPLANT
SLING ULTRA II LG (MISCELLANEOUS) ×3 IMPLANT
SPONGE LAP 18X18 RF (DISPOSABLE) ×2 IMPLANT
STAPLER SKIN PROX 35W (STAPLE) ×3 IMPLANT
STRAP SAFETY 5IN WIDE (MISCELLANEOUS) ×3 IMPLANT
SUT ETHIBOND 0 MO6 C/R (SUTURE) ×3 IMPLANT
SUT ULTRABRAID 2 COBRAID 38 (SUTURE) IMPLANT
SUT VIC AB 2-0 CT1 27 (SUTURE) ×6
SUT VIC AB 2-0 CT1 TAPERPNT 27 (SUTURE) ×2 IMPLANT
TAPE MICROFOAM 4IN (TAPE) ×3 IMPLANT
TUBING ARTHRO INFLOW-ONLY STRL (TUBING) ×3 IMPLANT
WAND WEREWOLF FLOW 90D (MISCELLANEOUS) ×3 IMPLANT

## 2020-09-14 NOTE — H&P (Signed)
History of Present Illness: Veronica Newton is a 69 y.o. who present today for history and physical. She is to undergo a right shoulder arthroscopy on 09/14/2020 for repair of nontraumatic complete tear of right rotator cuff. Last seen in clinic on 12/10/2019. No real change in her condition other than increased symptoms.  Patient began having symptoms several years ago but became worse last fall. Patient states she is very active doing at [place] yard work. Patient was initially seen by Waynard Edwards, PA-C who gave her steroid injection. However this provided only partial relief of symptoms and subsequently underwent MRI for further evaluation. Patient describes her symptoms as moderate and have quality of pain aching, miserable, nagging, shooting, tender and throbbing. Pain is localized over the lateral aspect of the shoulder and more anteriorly. Her symptoms are aggravated with normal daily activities such as sleeping, carrying heavy objects and over shoulder height activities. She has tried narcotics as well as muscle relaxers with limited benefit. She has tried rest with no significant benefit as well. Denies any upper extremity numbness, tingling or burning sensation. Patient states that his pain is increased to the point that she is ready to proceed with having surgery.  Past Medical History: . Depression  . Hyperlipidemia  . Hypertension  . IBS (irritable bowel syndrome)  . S/P reduction mammoplasty   Past Surgical History: . AUGMENTATION MAMMOPLASTY BILATERAL W/O PROSTHESIS Bilateral  . COLONOSCOPY 12/09/2019  Tubular adenoma of the colon/Repeat 89yrs/[facility]  . TONSILLECTOMY   Past Family History: . High blood pressure (Hypertension) Mother  . Melanoma Mother  . Coronary Artery Disease (Blocked arteries around heart) Father  . Alzheimer's disease Father   Medications: . amLODIPine (NORVASC) 5 MG tablet Take 1 tablet (5 mg total) by mouth once daily 90 tablet 3  . cyclobenzaprine  (FLEXERIL) 10 MG tablet TAKE 1 TABLET BY MOUTH THREE TIMES A DAY AS NEEDED FOR MUSCLE SPASMS 30 tablet 0  . FLUoxetine (PROZAC) 20 MG capsule Take 1 capsule (20 mg total) by mouth once daily 90 capsule 3  . hydroCHLOROthiazide (HYDRODIURIL) 25 MG tablet Take 1 tablet (25 mg total) by mouth once daily 90 tablet 3  . potassium chloride (KLOR-CON) 10 MEQ ER tablet TAKE 1 TABLET BY MOUTH EVERY DAY 90 tablet 3  . simvastatin (ZOCOR) 40 MG tablet Take 1 tablet (40 mg total) by mouth nightly 90 tablet 3  . traMADoL (ULTRAM) 50 mg tablet TAKE 1 TABLET BY MOUTH EVERY 6 HOURS AS NEEDED FOR PAIN 20 tablet 0   No current Epic-ordered facility-administered medications on file.   Allergies: No Known Allergies   Review of Systems: A comprehensive 14 point ROS was performed, reviewed, and the pertinent orthopaedic findings are documented in the HPI.  Physical Exam: BP 118/80  Ht 165.1 cm (5\' 5" )  Wt 86.5 kg (190 lb 9.6 oz)  BMI 31.72 kg/m   General: Well-developed well-nourished female seen in no acute distress.   HEENT: Atraumatic,normocephalic. Pupils are equal and reactive to light. Oropharynx is clear with moist mucosa  Lungs: Clear to auscultation bilaterally   Cardiovascular: Regular rate and rhythm. Normal S1, S2. No murmurs. No appreciable gallops or rubs. Peripheral pulses are palpable.  Abdomen: Soft, non-tender, nondistended. Bowel sounds present  Right shoulder exam: SKIN: normal SWELLING: none WARMTH: none LYMPH NODES: no adenopathy palpable CREPITUS: none TENDERNESS: Mild-moderately tender to palpation over bicipital groove more so than along lateral acromion ROM (active):  Forward flexion: 145 degrees Abduction: 95 degrees Internal rotation: L1  ROM (passive):  Forward flexion: 160 degrees Abduction: 145 degrees ER/IR at 90 abd: 90 degrees / 45 degrees  She has moderate to severe pain with attempted abduction, and moderate pain with forward flexion, internal  rotation, and internal rotation at 90 degrees of abduction.  STRENGTH: Forward flexion: 4-4+/5 Abduction: 4-4+/5 External rotation: 4+/5 Internal rotation: 4+/5 Pain with RC testing: Moderate pain with resisted abduction and mild pain with resisted forward flexion  STABILITY: Normal  SPECIAL TESTS: Juanetta Gosling' test: positive, moderate Speed's test: positive Capsulitis - pain w/ passive ER: no Crossed arm test: Mildly positive Crank: Not evaluated Anterior apprehension: Negative Posterior apprehension: Not evaluated  She is neurovascularly intact to the right upper extremity.  Neurological: The patient is alert and oriented Sensation to light touch appears to be intact and within normal limits Gross motor strength appeared to be equal to 5/5  Vascular : Peripheral pulses felt to be palpable. Capillary refill appears to be intact and within normal limits  X-ray: Shoulder Imaging, MRI: Right Shoulder: MRI Shoulder Cartilage: Partial thickness humeral head cartilage loss. Partial thickness glenoid cartilage loss. MRI Shoulder Rotator Cuff: Partial thickness tear of the subscapularis. No retraction. MRI Shoulder Labrum / Biceps: Biceps subluxation. MRI Shoulder Bone: Normal bone.  Both the films and report were reviewed by myself and discussed with the patient.   Impression: Nontraumatic complete tear of right rotator cuff.  Plan: The treatment options, including both surgical and nonsurgical choices, have been discussed in detail with the patient. The risks (including bleeding, infection, nerve and/or blood vessel injury, persistent or recurrent pain, stiffness, weakness, failure of the repair, recurrence of the tear, need for further surgery, blood clots, strokes, heart attacks or arrhythmias, pneumonia, etc.) and benefits of the surgical procedure were discussed. The patient states her understanding and agrees to proceed. A formal written consent will be obtained by the  nursing staff.   H&P reviewed and patient re-examined. No changes.

## 2020-09-14 NOTE — Anesthesia Procedure Notes (Signed)
Anesthesia Regional Block: Interscalene brachial plexus block   Pre-Anesthetic Checklist: ,, timeout performed, Correct Patient, Correct Site, Correct Laterality, Correct Procedure, Correct Position, site marked, Risks and benefits discussed,  Surgical consent,  Pre-op evaluation,  At surgeon's request and post-op pain management  Laterality: Upper and Right  Prep: chloraprep       Needles:  Injection technique: Single-shot  Needle Type: Echogenic Stimulator Needle     Needle Length: 10cm  Needle Gauge: 21   Needle insertion depth: 6 cm   Additional Needles:   Procedures: Doppler guided,,,, ultrasound used (permanent image in chart),,,,  Motor weakness within 5 minutes.  Narrative:  Start time: 09/14/2020 9:20 AM End time: 09/14/2020 9:24 AM Injection made incrementally with aspirations every 5 mL.  Performed by: Personally  Anesthesiologist: Christia Reading, MD  Additional Notes: Functioning IV was confirmed and O2 [place]/monitors were applied. Light sedation administered as required, patient responsive throughout. A 21ga EchoStim needle was used. Sterile prep and drape,hand hygiene and sterile gloves were used.  Negative aspiration and negative test dose prior to incremental administration of local anesthetic. 1% Lidocaine for skin wheal, 4 ml. Total LA: 43ml - Exparel 78ml & 0.5% Bupivicaine 50ml. U/S images stored in chart. The patient tolerated the procedure well.

## 2020-09-14 NOTE — Op Note (Signed)
09/14/2020  12:26 PM  Patient:   Veronica Newton  Pre-Op Diagnosis:   Impingement/tendinopathy with rotator cuff tear and biceps tendinopathy, right shoulder.  Post-Op Diagnosis:   Impingement/tendinopathy with supraspinatus and subscapularis rotator cuff tears, degenerative joint disease, degenerative labral fraying, and biceps tendinopathy, right shoulder.  Procedure:   Extensive arthroscopic debridement, arthroscopic repair of subscapularis tendon tear, arthroscopic subacromial decompression, mini-open rotator cuff repair, and mini-open biceps tenodesis, right shoulder.  Anesthesia:   General endotracheal with interscalene block using Exparel placed preoperatively by the anesthesiologist.  Surgeon:   Maryagnes Amos, MD  Assistant:   Carma Leaven, RNFA  Findings:   As above. There was a primarily longitudinal tear involving the superior insertional fibers of the subscapularis tendon with compromise of the articular footprint of approximately 50-60% involving the superior portion of the subscapularis tendon. There also was a near full-thickness articular surface tear involving the mid and anterior insertional fibers of the supraspinatus tendon. The biceps tendon demonstrated significant tendinopathic changes with partial-thickness tearing. There were diffuse grade 3 chondromalacial changes involving the entire humeral head and most of the glenoid articular surfaces of the labrum demonstrated moderate degenerative fraying without frank detachment from the glenoid.  Complications:   None  Fluids:   1000 cc  Estimated blood loss:   10 cc  Tourniquet time:   None  Drains:   None  Closure:   Staples      Brief clinical note:   The patient is a 69 year old female with a long history of progressively worsening right shoulder pain. The patient's symptoms have progressed despite medications, activity modification, etc. The patient's history and examination are consistent with  impingement/tendinopathy with a rotator cuff tear. These findings were confirmed by MRI scan. The patient presents at this time for definitive management of these shoulder symptoms.  Procedure:   The patient underwent placement of an interscalene block using Exparel by the anesthesiologist in the preoperative holding area before being brought into the operating room and lain in the supine position. The patient then underwent general endotracheal intubation and anesthesia before being repositioned in the beach chair position using the beach chair positioner. The right shoulder and upper extremity were prepped with ChloraPrep solution before being draped sterilely. Preoperative antibiotics were administered.   A timeout was performed to confirm the proper surgical site before the expected portal sites and incision site were injected with 0.5% Sensorcaine with epinephrine. A posterior portal was created and the glenohumeral joint thoroughly inspected with the findings as described above. An anterior portal was created using an outside-in technique. The labrum and rotator cuff were further probed, again confirming the above-noted findings. The areas of degenerative labral fraying were debrided back to stable margins using the full-radius resector. The full-radius resector also was used to debride the torn margins of the supraspinatus and subscapularis tendons, as well as to debride areas of synovitis and loose/unstable articular cartilage. The ArthroCare wand was inserted and used to release the biceps from its labral anchor. It also was used to obtain hemostasis as well as to "anneal" the labrum superiorly and anteriorly.   A separate superolateral portal site was created to act as a working portal before the exposed footprint on the lesser tuberosity was roughened with the full-radius resector to optimize healing. The torn portion of the subscapularis tendon was repaired using a single Mitek BioKnotless anchor  placed through the anterior portal. The adequacy of this repair was verified both by probing as well as with gentle  passive external rotation of the shoulder. The instruments were removed from the joint after suctioning the excess fluid.  The camera was repositioned through the posterior portal into the subacromial space. A separate lateral portal was created using an outside-in technique. The 3.5 mm full-radius resector was introduced and used to perform a subtotal bursectomy. The ArthroCare wand was then inserted and used to remove the periosteal tissue off the undersurface of the anterior third of the acromion as well as to recess the coracoacromial ligament from its attachment along the anterior and lateral margins of the acromion. The 4.0 mm acromionizing bur was introduced and used to complete the decompression by removing the undersurface of the anterior third of the acromion. The full radius resector was reintroduced to remove any residual bony debris before the ArthroCare wand was reintroduced to obtain hemostasis. The instruments were then removed from the subacromial space after suctioning the excess fluid.  An approximately 4-5 cm incision was made over the anterolateral aspect of the shoulder beginning at the anterolateral corner of the acromion and extending distally in line with the bicipital groove. This incision was carried down through the subcutaneous tissues to expose the deltoid fascia. The raphae between the anterior and middle thirds was identified and this plane developed to provide access into the subacromial space. Additional bursal tissues were debrided sharply using Metzenbaum scissors. The rotator cuff tear was readily identified. The margins were debrided sharply with a #15 blade and the exposed greater tuberosity roughened with a rongeur. The tear was repaired using a single Biomet 2.9 mm JuggerKnot anchor. These sutures were then brought back laterally and secured using one [employer] knotless RegeneSorb anchor to create a two-layer closure. An apparent watertight closure was obtained.  The bicipital groove was identified by palpation and opened for 1-1.5 cm. The biceps tendon stump was retrieved through this defect. The floor of the bicipital groove was roughened with a curet before another Biomet 2.9 mm JuggerKnot anchor was inserted. Both sets of sutures were passed through the biceps tendon and tied securely to effect the tenodesis. The bicipital sheath was reapproximated using two #0 Ethibond interrupted sutures, incorporating the biceps tendon to further reinforce the tenodesis.  The wound was copiously irrigated with sterile saline solution before the deltoid raphae was reapproximated using 2-0 Vicryl interrupted sutures. The subcutaneous tissues were closed in two layers using 2-0 Vicryl interrupted sutures before the skin was closed using staples. The portal sites also were closed using staples. A sterile bulky dressing was applied to the shoulder before the arm was placed into a shoulder immobilizer. The patient was then awakened, extubated, and returned to the recovery room in satisfactory condition after tolerating the procedure well.

## 2020-09-14 NOTE — Discharge Instructions (Addendum)
Orthopedic discharge instructions: Keep dressing dry and intact.  May shower after dressing changed on post-op day #4 (Monday).  Cover staples with Band-Aids after drying off. Apply ice frequently to shoulder. Take ibuprofen 600-800 mg TID with meals for 7-10 days, then as necessary.    TID = 3 times per day (every 8 hours) Take oxycodone as prescribed when needed.  May supplement with ES Tylenol if necessary. Keep shoulder immobilizer on at all times except may remove for bathing purposes. Follow-up in 10-14 days or as scheduled.     Interscalene Nerve Block with Exparel  1.  For your surgery you have received an Interscalene Nerve Block with Exparel. 2. Nerve Blocks affect many types of nerves, including nerves that control movement, pain and normal sensation.  You may experience feelings such as numbness, tingling, heaviness, weakness or the inability to move your arm or the feeling or sensation that your arm has "fallen asleep". 3. A nerve block with Exparel can last up to 5 days.  Usually the weakness wears off first.  The tingling and heaviness usually wear off next.  Finally you may start to notice pain.  Keep in mind that this may occur in any order.  Once a nerve block starts to wear off it is usually completely gone within 60 minutes. 4. ISNB may cause mild shortness of breath, a hoarse voice, blurry vision, unequal pupils, or drooping of the face on the same side as the nerve block.  These symptoms will usually resolve with the numbness.  Very rarely the procedure itself can cause mild seizures. 5. If needed, your surgeon will give you a prescription for pain medication.  It will take about 60 minutes for the oral pain medication to become fully effective.  So, it is recommended that you start taking this medication before the nerve block first begins to wear off, or when you first begin to feel discomfort. 6. Take your pain medication only as prescribed.  Pain medication can cause  sedation and decrease your breathing if you take more than you need for the level of pain that you have. 7. Nausea is a common side effect of many pain medications.  You may want to eat something before taking your pain medicine to prevent nausea. 8. After an Interscalene nerve block, you cannot feel pain, pressure or extremes in temperature in the effected arm.  Because your arm is numb it is at an increased risk for injury.  To decrease the possibility of injury, please practice the following:  a. While you are awake change the position of your arm frequently to prevent too much pressure on any one area for prolonged periods of time. b.  If you have a cast or tight dressing, check the color or your fingers every couple of hours.  Call your surgeon with the appearance of any discoloration (white or blue). c. If you are given a sling to wear before you go home, please wear it  at all times until the block has completely worn off.  Do not get up at night without your sling. d. Please contact [facility] or your surgeon if you do not begin to regain sensation after 7 days from the surgery.  Anesthesia may be contacted by calling the Same Day Surgery Department, Mon. through Fri., 6 am to 4 pm at [phone].   e. If you experience any other problems or concerns, please contact your surgeon's office. f. If you experience severe or prolonged shortness of breath  go to the nearest emergency department.  AMBULATORY SURGERY  DISCHARGE INSTRUCTIONS   1) The drugs that you were given will stay in your system until tomorrow so for the next 24 hours you should not:  A) Drive an automobile B) Make any legal decisions C) Drink any alcoholic beverage   2) You may resume regular meals tomorrow.  Today it is better to start with liquids and gradually work up to solid foods.  You may eat anything you prefer, but it is better to start with liquids, then soup and crackers, and gradually work up to solid  foods.   3) Please notify your doctor immediately if you have any unusual bleeding, trouble breathing, redness and pain at the surgery site, drainage, fever, or pain not relieved by medication.    4) Additional Instructions:        Please contact your physician with any problems or Same Day Surgery at [phone], Monday through Friday 6 am to 4 pm, or [place] at [facility] number at [phone].

## 2020-09-14 NOTE — Anesthesia Procedure Notes (Signed)
Procedure Name: Intubation Date/Time: 09/14/2020 10:22 AM Performed by: Wilder Glade, CRNA Pre-anesthesia Checklist: Patient identified, Emergency Drugs available, Suction available, Patient being monitored and Timeout performed Patient Re-evaluated:Patient Re-evaluated prior to induction Oxygen Delivery Method: Circle system utilized Preoxygenation: Pre-oxygenation with 100% oxygen Induction Type: IV induction Ventilation: Mask ventilation without difficulty Laryngoscope Size: Miller and 2 Grade View: Grade I Tube type: Oral Number of attempts: 1 (brief atraumatic dentition unchanged) Airway Equipment and Method: Stylet Placement Confirmation: ETT inserted through vocal cords under direct vision,  positive ETCO2 and breath sounds checked- equal and bilateral Secured at: 21 cm Tube secured with: Tape Dental Injury: Teeth and Oropharynx as per pre-operative assessment

## 2020-09-14 NOTE — Anesthesia Postprocedure Evaluation (Signed)
Anesthesia Post Note  Patient: Veronica Newton  Procedure(s) Performed: Right shoulder arthroscopy with debridement, decompression, rotator cuff repair, and biceps tenodesis. (Right Shoulder)  Patient location during evaluation: PACU Anesthesia Type: General Level of consciousness: awake and alert Pain management: pain level controlled (Good block function) Vital Signs Assessment: post-procedure vital signs reviewed and stable Respiratory status: spontaneous breathing, nonlabored ventilation and respiratory function stable Cardiovascular status: blood pressure returned to baseline and stable Postop Assessment: no apparent nausea or vomiting Anesthetic complications: no   No complications documented.   Last Vitals:  Vitals:   09/14/20 1315 09/14/20 1325  BP:  (!) 113/58  Pulse: 72 73  Resp: 20 18  Temp:  (!) 36.1 C  SpO2: 96% 96%    Last Pain:  Vitals:   09/14/20 1325  TempSrc: Temporal  PainSc: 0-No pain                 Christia Reading

## 2020-09-14 NOTE — Anesthesia Preprocedure Evaluation (Addendum)
Anesthesia Evaluation  Patient identified by MRN, date of birth, ID band Patient awake    Reviewed: Allergy & Precautions, H&P , NPO status , reviewed documented beta blocker date and time   Airway Mallampati: III  TM Distance: >3 FB Neck ROM: limited    Dental  (+) Chipped, Missing, Partial Lower, Partial Upper, Poor Dentition   Pulmonary former smoker,    Pulmonary exam normal        Cardiovascular hypertension, Normal cardiovascular exam     Neuro/Psych PSYCHIATRIC DISORDERS Anxiety    GI/Hepatic neg GERD  ,  Endo/Other    Renal/GU      Musculoskeletal   Abdominal   Peds  Hematology   Anesthesia Other Findings Past Medical History: No date: Anxiety No date: Hyperlipidemia No date: Hypertension Past Surgical History: 1994: REDUCTION MAMMAPLASTY; Bilateral   Reproductive/Obstetrics                            Anesthesia Physical Anesthesia Plan  ASA: III  Anesthesia Plan: General   Post-op Pain Management:  Regional for Post-op pain   Induction: Intravenous  PONV Risk Score and Plan: 3 and Ondansetron, Treatment may vary due to age or medical condition, Midazolam and Dexamethasone  Airway Management Planned: Oral ETT  Additional Equipment:   Intra-op Plan:   Post-operative Plan: Extubation in OR  Informed Consent: I have reviewed the patients History and Physical, chart, labs and discussed the procedure including the risks, benefits and alternatives for the proposed anesthesia with the patient or authorized representative who has indicated his/her understanding and acceptance.     Dental Advisory Given  Plan Discussed with: CRNA  Anesthesia Plan Comments:        Anesthesia Quick Evaluation

## 2020-09-14 NOTE — Transfer of Care (Signed)
Immediate Anesthesia Transfer of Care Note  Patient: Veronica Newton  Procedure(s) Performed: Right shoulder arthroscopy with debridement, decompression, rotator cuff repair, and biceps tenodesis. (Right Shoulder)  Patient Location: PACU  Anesthesia Type:General and Regional  Level of Consciousness: awake, alert , oriented and drowsy  Airway & Oxygen Therapy: Patient Spontanous Breathing and Patient connected to nasal cannula oxygen  Post-op Assessment: Report given to RN and Post -op Vital signs reviewed and stable  Post vital signs: Reviewed and stable  Last Vitals:  Vitals Value Taken Time  BP 119/68 09/14/20 1215  Temp 36.2 C 09/14/20 1215  Pulse 83 09/14/20 1219  Resp 26 09/14/20 1219  SpO2 97 % 09/14/20 1219  Vitals shown include unvalidated device data.  Last Pain:  Vitals:   09/14/20 1215  TempSrc:   PainSc: Asleep         Complications: No complications documented.

## 2021-01-06 IMAGING — MG DIGITAL SCREENING BILAT W/ TOMO W/ CAD
8 series · 8 of 24 positions shown · non-contrast
Comparison: Previous exam(s).

CLINICAL DATA: Screening.

EXAM:
DIGITAL SCREENING BILATERAL MAMMOGRAM WITH TOMO AND CAD

[R MLO synth-2D]
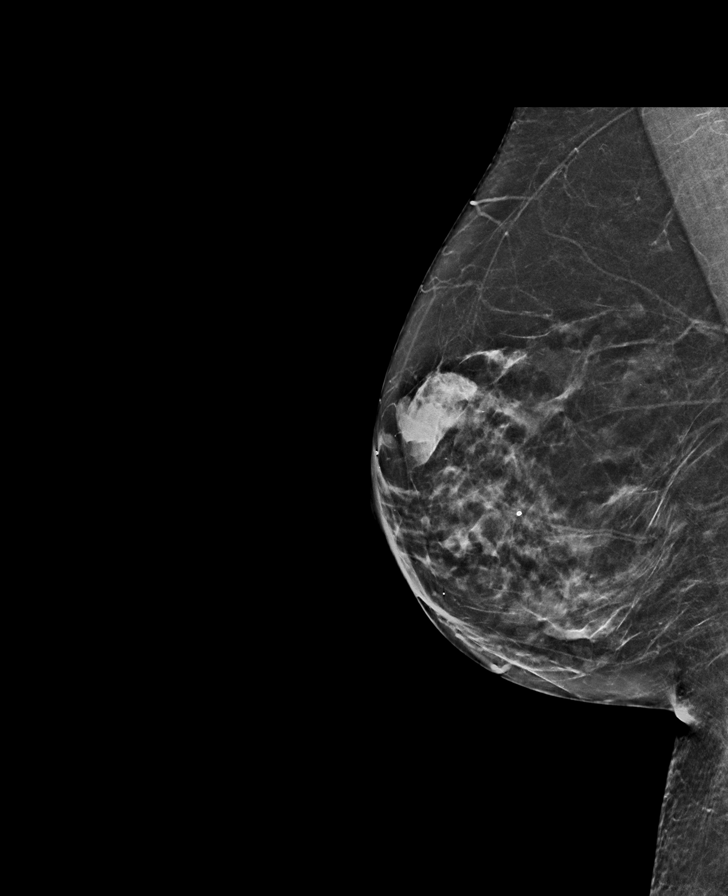

[L MLO synth-2D]
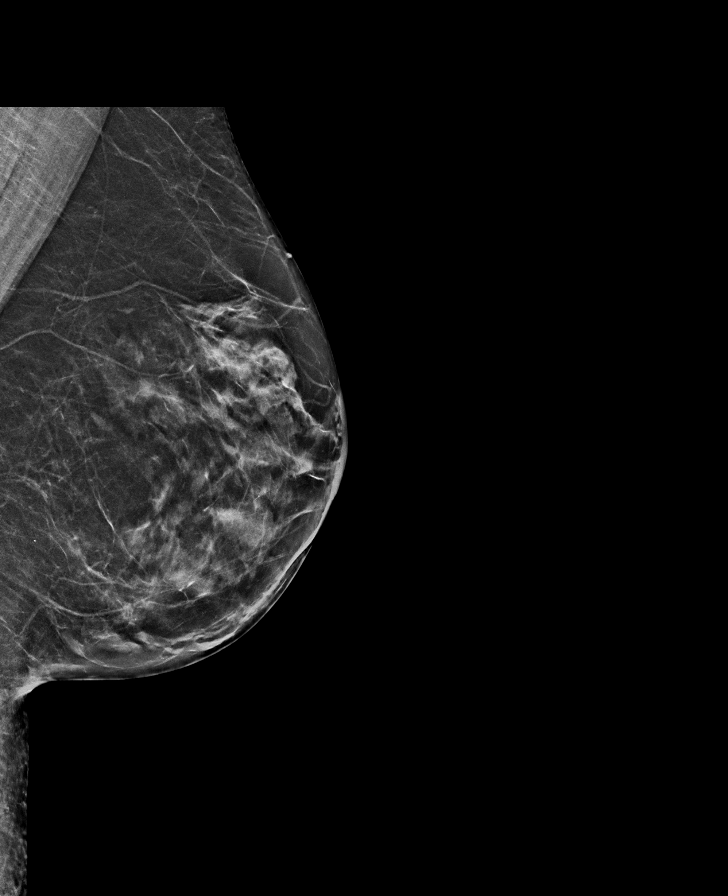

[R CC synth-2D]
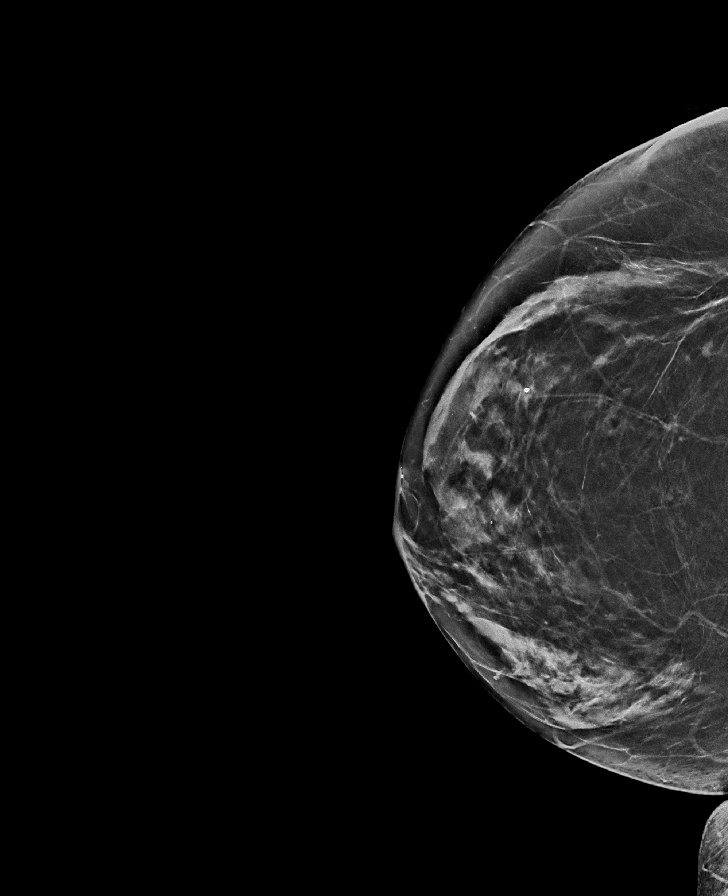

[L CC synth-2D]
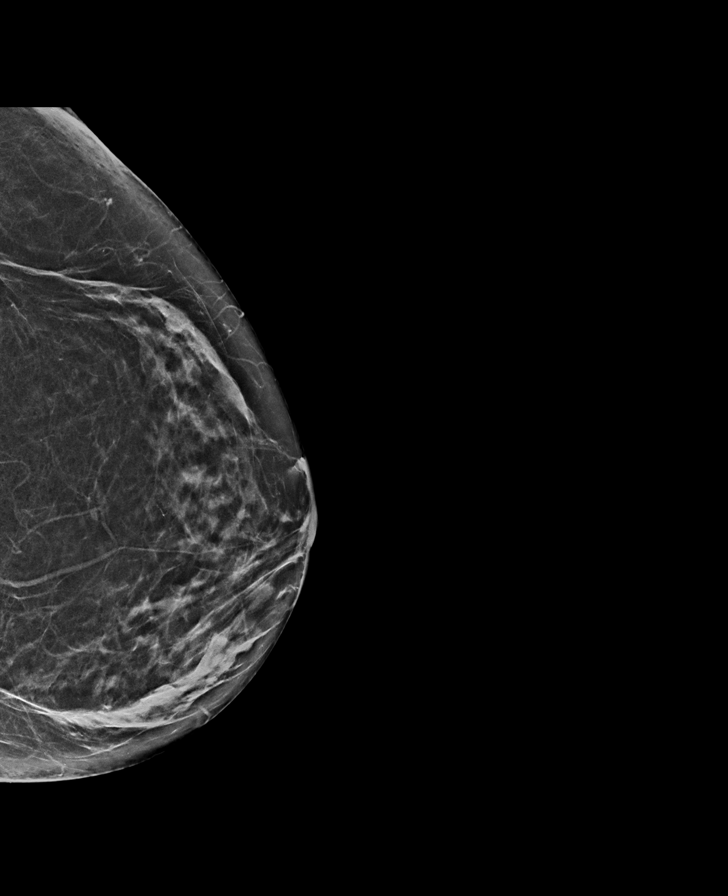

[L MLO tomo · tomo slice 30/59.0]
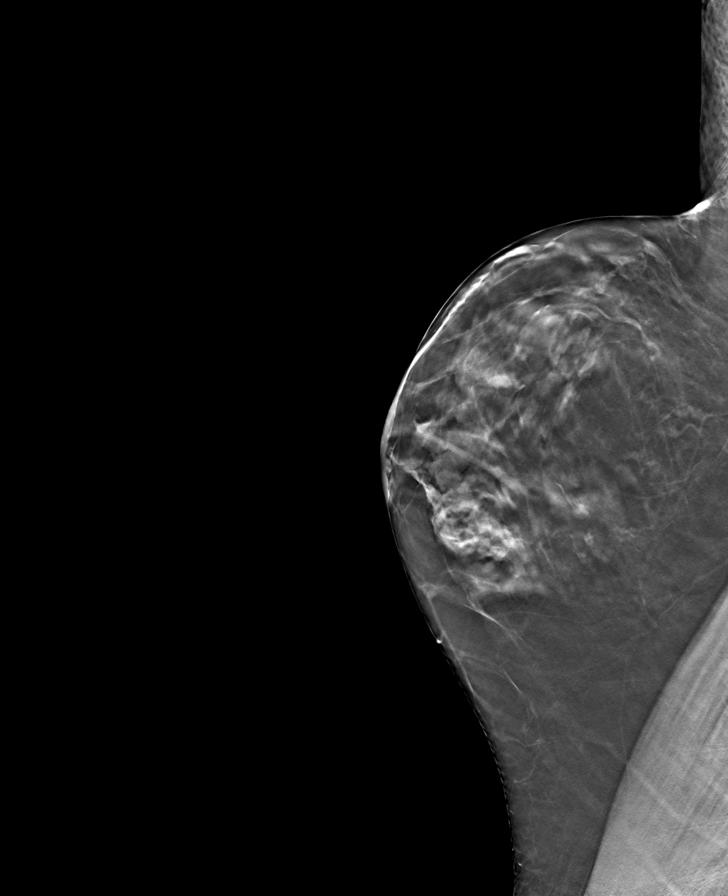

[R MLO tomo · tomo slice 31/60.0]
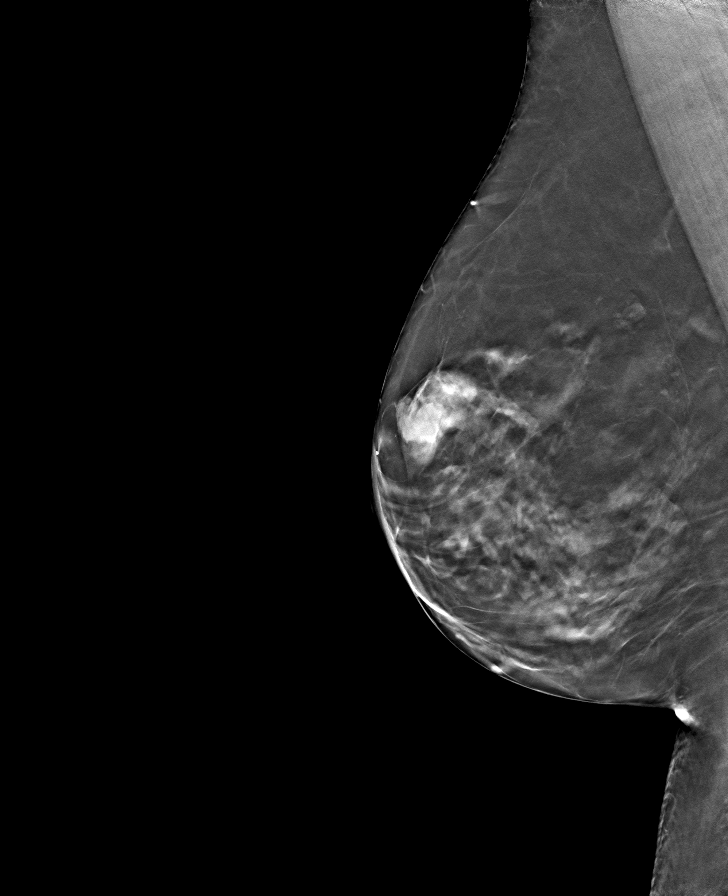

[R CC tomo · tomo slice 31/61.0]
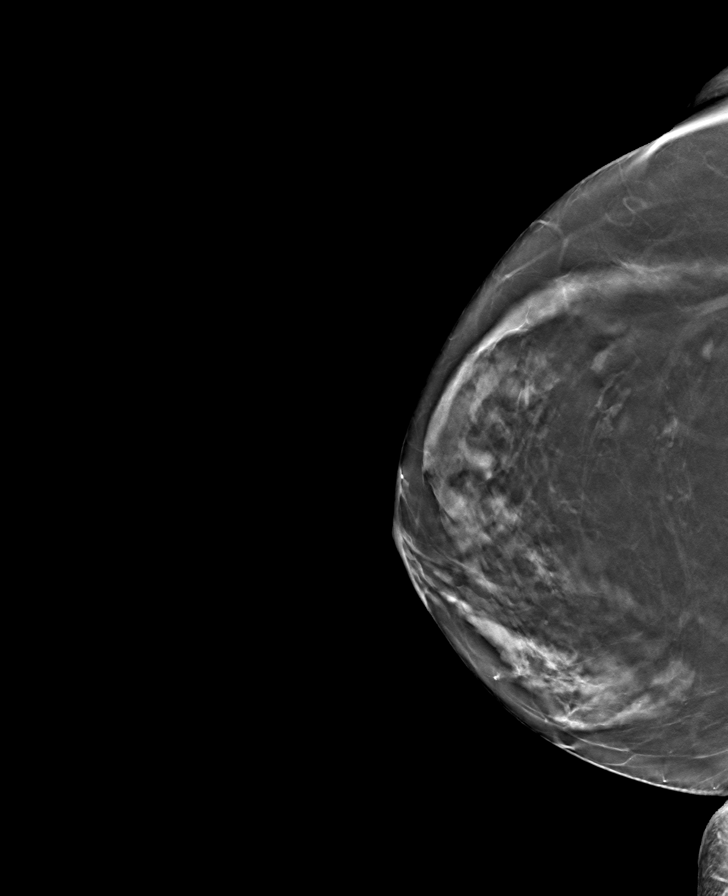

[L CC tomo · tomo slice 30/59.0]
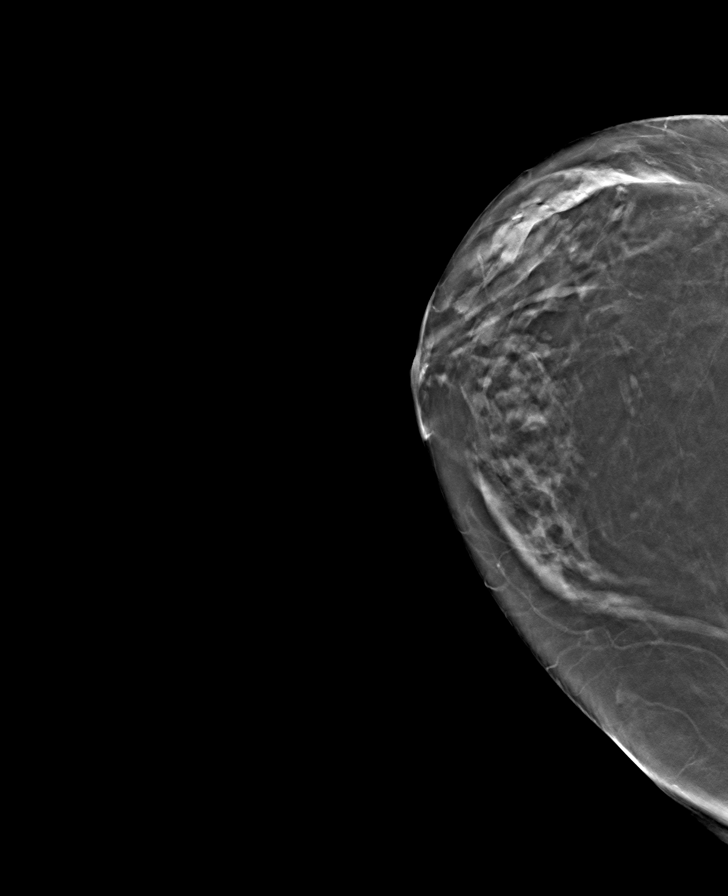

[8 of 24 positions shown; findings below may reference images not displayed]

ACR Breast Density Category c: The breast tissue is heterogeneously
dense, which may obscure small masses.
FINDINGS: There are no findings suspicious for malignancy. Images were
processed with CAD.
IMPRESSION: No mammographic evidence of malignancy. A result letter of this
screening mammogram will be mailed directly to the patient.

RECOMMENDATION:
Screening mammogram in one year. (Code:[id number]-U-LHB)

BI-RADS CATEGORY  1: Negative.

## 2021-01-15 ENCOUNTER — Ambulatory Visit: Payer: Self-pay | Admitting: Podiatry

## 2021-01-17 ENCOUNTER — Ambulatory Visit: Payer: Self-pay | Admitting: Podiatry

## 2021-01-19 ENCOUNTER — Ambulatory Visit: Payer: Self-pay | Admitting: Podiatry

## 2024-01-28 ENCOUNTER — Other Ambulatory Visit: Payer: Self-pay | Admitting: Family Medicine

## 2024-01-28 DIAGNOSIS — Z1231 Encounter for screening mammogram for malignant neoplasm of breast: Secondary | ICD-10-CM

## 2024-03-24 ENCOUNTER — Ambulatory Visit: Payer: Medicare HMO | Admitting: Urology

## 2024-03-24 VITALS — BP 114/76 | HR 80 | Ht 65.0 in | Wt 207.5 lb

## 2024-03-24 DIAGNOSIS — R3915 Urgency of urination: Secondary | ICD-10-CM

## 2024-03-24 DIAGNOSIS — K589 Irritable bowel syndrome without diarrhea: Secondary | ICD-10-CM | POA: Insufficient documentation

## 2024-03-24 DIAGNOSIS — Z87891 Personal history of nicotine dependence: Secondary | ICD-10-CM | POA: Diagnosis not present

## 2024-03-24 DIAGNOSIS — E785 Hyperlipidemia, unspecified: Secondary | ICD-10-CM | POA: Insufficient documentation

## 2024-03-24 DIAGNOSIS — R3129 Other microscopic hematuria: Secondary | ICD-10-CM | POA: Diagnosis not present

## 2024-03-24 DIAGNOSIS — I10 Essential (primary) hypertension: Secondary | ICD-10-CM | POA: Insufficient documentation

## 2024-03-24 LAB — MICROSCOPIC EXAMINATION: Epithelial Cells (non renal): >10 /HPF — AB (ref 0–10)

## 2024-03-24 LAB — URINALYSIS, COMPLETE
Bilirubin, UA: NEGATIVE
Glucose, UA: NEGATIVE
Ketones, UA: NEGATIVE
Leukocytes,UA: NEGATIVE
Nitrite, UA: NEGATIVE
Protein,UA: NEGATIVE
Specific Gravity, UA: >1.03 — ABNORMAL HIGH (ref 1.005–1.030)
Urobilinogen, Ur: 0.2 mg/dL (ref 0.2–1.0)
pH, UA: 5.5 (ref 5.0–7.5)

## 2024-03-24 LAB — BLADDER SCAN AMB NON-IMAGING: Scan Result: 64

## 2024-03-24 NOTE — Patient Instructions (Signed)

## 2024-03-24 NOTE — Progress Notes (Signed)
 Veronica Newton,acting as a scribe for Vanna Scotland, MD.,have documented all relevant documentation on the behalf of Vanna Scotland, MD,as directed by  Vanna Scotland, MD while in the presence of Vanna Scotland, MD.  03/24/24 1:17 PM   Veronica Newton [birth year] [id number]  Referring provider: Jerl Mina, MD [address] [facility] [place],  [place] [address]  Chief Complaint  Patient presents with   Establish Care    Microscopic hematuria     HPI: 73 y/o female referred for further evaluation of microscopic hematuria.   In January, her urinalysis showed six red blood cells per high power field, otherwise unremarkable. She has no recent cross-sectional imaging, and her creatinine is normal. As such, she was sent to urology for further evaluation.  She reports urgency and leakage, with occasional back pain, sometimes on one side, but no documented history of kidney stones. She experiences a blue-green tint in her urine but denies seeing blood. She has urgency and frequency, often feeling the need to urinate immediately but producing little urine. At night, she sometimes wakes up with pressure but no significant output. She acknowledges not drinking enough fluids, typically consuming two cups of coffee, a soda, and a glass of tea daily, all of which are bladder irritants. She has no significant pain or pressure other than the urgency and frequency. She used to have strong urinations with Hydrochlorothiazide, but this has diminished.  She has a history of smoking, having smoked less than a pack a day from age 54 to 35, totaling approximately 30 years. She stopped smoking 15 years ago.  Results for orders placed or performed in visit on 03/24/24  Bladder Scan (Post Void Residual) in office  Result Value Ref Range   Scan Result 64 ml     PMH: Past Medical History:  Diagnosis Date   Anxiety    Hyperlipidemia    Hypertension     Surgical History: Past Surgical  History:  Procedure Laterality Date   REDUCTION MAMMAPLASTY Bilateral 1994   SHOULDER ARTHROSCOPY WITH OPEN ROTATOR CUFF REPAIR Right 09/14/2020   Procedure: Right shoulder arthroscopy with debridement, decompression, rotator cuff repair, and biceps tenodesis.;  Surgeon: Christena Flake, MD;  Location: [facility] ORS;  Service: Orthopedics;  Laterality: Right;    Home Medications:  Allergies as of 03/24/2024   No Known Allergies      Medication List        Accurate as of March 24, 2024  1:17 PM. If you have any questions, ask your nurse or doctor.          STOP taking these medications    albuterol 108 (90 Base) MCG/ACT inhaler Commonly known as: VENTOLIN HFA Stopped by: Vanna Scotland   EasiVent inhaler Stopped by: Vanna Scotland   ibuprofen 200 MG tablet Commonly known as: ADVIL Stopped by: Vanna Scotland   ofloxacin 0.3 % ophthalmic solution Commonly known as: OCUFLOX Stopped by: Vanna Scotland   oxyCODONE 5 MG immediate release tablet Commonly known as: Roxicodone Stopped by: Vanna Scotland   prednisoLONE acetate 1 % ophthalmic suspension Commonly known as: PRED FORTE Stopped by: Vanna Scotland   simvastatin 40 MG tablet Commonly known as: ZOCOR Stopped by: Vanna Scotland       TAKE these medications    amLODipine 5 MG tablet Commonly known as: NORVASC Take 5 mg by mouth daily.   FLUoxetine 20 MG capsule Commonly known as: PROZAC Take 20 mg by mouth daily.   hydrochlorothiazide 25 MG  tablet Commonly known as: HYDRODIURIL Take 25 mg by mouth daily.   potassium chloride 10 MEQ tablet Commonly known as: KLOR-CON Take 10 mEq by mouth daily. What changed: Another medication with the same name was removed. Continue taking this medication, and follow the directions you see here. Changed by: Vanna Scotland   rosuvastatin 10 MG tablet Commonly known as: CRESTOR Take 10 mg by mouth daily.         Family History: Family History  Problem Relation  Age of Onset   Breast cancer Paternal Grandmother     Social History:  reports that she quit smoking about 10 years ago. She has never used smokeless tobacco. She reports current drug use. Drug: Marijuana. She reports that she does not drink alcohol.   Physical Exam: BP 114/76   Pulse 80   Ht 5\' 5"  (1.651 m)   Wt 207 lb 8 oz (94.1 kg)   BMI 34.53 kg/m   Constitutional:  Alert and oriented, No acute distress. HEENT: [place] AT, moist mucus membranes.  Trachea midline, no masses. Neurologic: Grossly intact, no focal deficits, moving all 4 extremities. Psychiatric: Normal mood and affect.  Results for orders placed or performed in visit on 03/24/24  Microscopic Examination   Collection Time: 03/24/24 10:08 AM   Urine  Result Value Ref Range   WBC, UA 0-5 0 - 5 /hpf   RBC, Urine 3-10 (A) 0 - 2 /hpf   Epithelial Cells (non renal) >10 (A) 0 - 10 /hpf   Mucus, UA Present (A) Not Estab.   Bacteria, UA Few None seen/Few  Urinalysis, Complete   Collection Time: 03/24/24 10:08 AM  Result Value Ref Range   Specific Gravity, UA >1.030 (H) 1.005 - 1.030   pH, UA 5.5 5.0 - 7.5   Color, UA Yellow Yellow   Appearance Ur Clear Clear   Leukocytes,UA Negative Negative   Protein,UA Negative Negative/Trace   Glucose, UA Negative Negative   Ketones, UA Negative Negative   RBC, UA 1+ (A) Negative   Bilirubin, UA Negative Negative   Urobilinogen, Ur 0.2 0.2 - 1.0 mg/dL   Nitrite, UA Negative Negative   Microscopic Examination See below:   Bladder Scan (Post Void Residual) in office   Collection Time: 03/24/24 10:24 AM  Result Value Ref Range   Scan Result 64 ml       Assessment & Plan:    1. Microscopic Hematuria (high risk) - She presents with microscopic hematuria, previously identified with six red blood cells on urinalysis.  - No recent cross-sectional imaging has been performed, and she has a history of smoking, which is a risk factor for urological malignancies.  - A CT scan of the  kidneys will be ordered to evaluate for any underlying pathology. - If the CT scan reveals any abnormalities, a cystoscopy may be considered to further investigate the cause of hematuria.  2. Urinary Urgency and Frequency - She reports urinary urgency and frequency, which may be related to bladder irritants such as caffeine and soda.  - She is advised to increase water intake and reduce consumption of bladder irritants. - If symptoms persist, additional therapy for overactive bladder may be considered.  3. History of Smoking - She has a significant smoking history, having smoked for approximately 30 years.  - Although she quit smoking 15 years ago, this history increases her risk for urological conditions.  - Continued monitoring and evaluation are recommended.  Return for review of CT results and discussion of  further treatment plans.  I have reviewed the above documentation for accuracy and completeness, and I agree with the above.   Vanna Scotland, MD   [facility] [address] [place], [place] [address] [phone]

## 2024-04-13 ENCOUNTER — Ambulatory Visit
Admission: RE | Admit: 2024-04-13 | Discharge: 2024-04-13 | Disposition: A | Discharge status: Home / Self Care | Source: Ambulatory Visit | Attending: Urology | Admitting: Urology

## 2024-04-13 DIAGNOSIS — R3915 Urgency of urination: Secondary | ICD-10-CM | POA: Insufficient documentation

## 2024-04-13 DIAGNOSIS — R3129 Other microscopic hematuria: Secondary | ICD-10-CM | POA: Insufficient documentation

## 2024-04-13 DIAGNOSIS — Z87891 Personal history of nicotine dependence: Secondary | ICD-10-CM | POA: Diagnosis present

## 2024-04-13 MED ORDER — IOHEXOL 300 MG/ML  SOLN
100.0000 mL | Freq: Once | INTRAMUSCULAR | Status: AC | PRN
Start: 2024-04-13 — End: 2024-04-13
  Administered 2024-04-13: 100 mL via INTRAVENOUS

## 2024-04-15 ENCOUNTER — Encounter: Payer: Self-pay | Admitting: Urology

## 2024-04-15 DIAGNOSIS — R9389 Abnormal findings on diagnostic imaging of other specified body structures: Secondary | ICD-10-CM

## 2024-04-22 ENCOUNTER — Ambulatory Visit
Admission: RE | Admit: 2024-04-22 | Discharge: 2024-04-22 | Disposition: A | Discharge status: Home / Self Care | Source: Ambulatory Visit | Attending: Urology | Admitting: Urology

## 2024-04-22 DIAGNOSIS — R9389 Abnormal findings on diagnostic imaging of other specified body structures: Secondary | ICD-10-CM | POA: Diagnosis present

## 2024-05-19 ENCOUNTER — Encounter: Payer: Self-pay | Admitting: Urology

## 2024-05-19 ENCOUNTER — Ambulatory Visit: Admitting: Urology

## 2024-05-19 VITALS — BP 114/73 | HR 83 | Ht 65.0 in | Wt 209.0 lb

## 2024-05-19 DIAGNOSIS — R9389 Abnormal findings on diagnostic imaging of other specified body structures: Secondary | ICD-10-CM

## 2024-05-19 DIAGNOSIS — R3129 Other microscopic hematuria: Secondary | ICD-10-CM | POA: Diagnosis not present

## 2024-05-19 LAB — URINALYSIS, COMPLETE
Bilirubin, UA: NEGATIVE
Glucose, UA: NEGATIVE
Ketones, UA: NEGATIVE
Leukocytes,UA: NEGATIVE
Nitrite, UA: NEGATIVE
Protein,UA: NEGATIVE
Specific Gravity, UA: 1.025 (ref 1.005–1.030)
Urobilinogen, Ur: 0.2 mg/dL (ref 0.2–1.0)
pH, UA: 6 (ref 5.0–7.5)

## 2024-05-19 LAB — MICROSCOPIC EXAMINATION

## 2024-05-19 NOTE — Progress Notes (Signed)
   05/19/24  CC:  Chief Complaint  Patient presents with   Hematuria    HPI: 73 year old female with a personal history of microscopic hematuria who presents today for cystoscopy.  She underwent CT urogram which was unremarkable other than for incidental finding of endometrial thickening.  She had a follow-up pelvic ultrasound that showed endometrial polyps.  She still does have 3-10 red blood cells per high-powered field.  No upper tract pathology identified on CT urogram.  Blood pressure 114/73, pulse 83, height 5\' 5"  (1.651 m), weight 209 lb (94.8 kg). NED. A&Ox3.   No respiratory distress   Abd soft, NT, ND Normal external genitalia with patent urethral meatus  Cystoscopy Procedure Note  Patient identification was confirmed, informed consent was obtained, and patient was prepped using Betadine solution.  Lidocaine jelly was administered per urethral meatus.    Procedure: - Flexible cystoscope introduced, without any difficulty.   - Thorough search of the bladder revealed:    normal urethral meatus    normal urothelium    no stones    no ulcers     no tumors    no urethral polyps    no trabeculation  - Ureteral orifices were normal in position and appearance.  Post-Procedure: - Patient tolerated the procedure well  Assessment/ Plan:  1. Microscopic hematuria (Primary) Hematuria evaluation completed without any identifiable pathology or etiology  Given that she is higher risk, will plan to reevaluate with cystoscopy at minimum 2 years if her hematuria persist or sooner if she develops gross hematuria - Urinalysis, Complete  2. Thickened endometrium Referral to OB/GYN for further management - Urinalysis, Complete   F/u recall list 2 years for UA/ possible cysto or sooner as needed  Dustin Gimenez, MD

## 2024-07-06 ENCOUNTER — Ambulatory Visit: Admitting: Obstetrics and Gynecology

## 2024-07-06 ENCOUNTER — Encounter: Payer: Self-pay | Admitting: Obstetrics and Gynecology

## 2024-07-06 VITALS — BP 112/70 | Ht 65.0 in | Wt 208.1 lb

## 2024-07-06 DIAGNOSIS — D252 Subserosal leiomyoma of uterus: Secondary | ICD-10-CM | POA: Diagnosis not present

## 2024-07-06 DIAGNOSIS — N840 Polyp of corpus uteri: Secondary | ICD-10-CM

## 2024-07-06 DIAGNOSIS — Z7689 Persons encountering health services in other specified circumstances: Secondary | ICD-10-CM

## 2024-07-06 NOTE — Progress Notes (Signed)
 HPI:      [name] is a 73 y.o. G2P0020 who LMP was No LMP recorded. Patient is postmenopausal.  Subjective:   She presents today after having endometrial polyps found on an ultrasound.  These were found as an incidental finding.  Her endometrial thickness is 3 mm.  She has not had any postmenopausal bleeding.    Hx: The following portions of the patient's history were reviewed and updated as appropriate:             She  has a past medical history of Anxiety, Hyperlipidemia, and Hypertension. She does not have any pertinent problems on file. She  has a past surgical history that includes Reduction mammaplasty (Bilateral, 1994) and Shoulder arthroscopy with open rotator cuff repair (Right, 09/14/2020). Her family history includes Breast cancer in her paternal grandmother. She  reports that she quit smoking about 10 years ago. Her smoking use included cigarettes. She has never used smokeless tobacco. She reports current drug use. Drug: Marijuana. She reports that she does not drink alcohol. She has a current medication list which includes the following prescription(s): amlodipine, fluoxetine, hydrochlorothiazide, potassium chloride , and rosuvastatin. She has no known allergies.       Review of Systems:  Review of Systems  Constitutional: Denied constitutional symptoms, night sweats, recent illness, fatigue, fever, insomnia and weight loss.  Eyes: Denied eye symptoms, eye pain, photophobia, vision change and visual disturbance.  Ears/Nose/Throat/Neck: Denied ear, nose, throat or neck symptoms, hearing loss, nasal discharge, sinus congestion and sore throat.  Cardiovascular: Denied cardiovascular symptoms, arrhythmia, chest pain/pressure, edema, exercise intolerance, orthopnea and palpitations.  Respiratory: Denied pulmonary symptoms, asthma, pleuritic pain, productive sputum, cough, dyspnea and wheezing.  Gastrointestinal: Denied, gastro-esophageal reflux, melena, nausea and vomiting.   Genitourinary: Denied genitourinary symptoms including symptomatic vaginal discharge, pelvic relaxation issues, and urinary complaints.  Musculoskeletal: Denied musculoskeletal symptoms, stiffness, swelling, muscle weakness and myalgia.  Dermatologic: Denied dermatology symptoms, rash and scar.  Neurologic: Denied neurology symptoms, dizziness, headache, neck pain and syncope.  Psychiatric: Denied psychiatric symptoms, anxiety and depression.  Endocrine: Denied endocrine symptoms including hot flashes and night sweats.   Meds:   Current Outpatient Medications on File Prior to Visit  Medication Sig Dispense Refill   amLODipine (NORVASC) 5 MG tablet Take 5 mg by mouth daily.     FLUoxetine (PROZAC) 20 MG capsule Take 20 mg by mouth daily.     hydrochlorothiazide (HYDRODIURIL) 25 MG tablet Take 25 mg by mouth daily.     potassium chloride  (KLOR-CON ) 10 MEQ tablet Take 10 mEq by mouth daily.     rosuvastatin (CRESTOR) 10 MG tablet Take 10 mg by mouth daily.     No current facility-administered medications on file prior to visit.      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 Establishing care with new doctor, encounter for  2. Endometrial polyp   3. Fibroids, subserous     Asymptomatic endometrial polyps.   Plan:            1.  Discussed care with Dr. Mancil    Offered patient the choice of ultrasound follow-up in 3 to 6 months to check stability of endometrial polyps versus hysteroscopy D&C.  Choices  are not exactly equal as I would lean toward hysteroscopy D&C, however risk of endometrial cancer in and asymptomatic polyp is low. Patient says this is a bad time for surgery for her as her husband is undergoing cancer surgery for his kidney and she is having significant dental work done.  She much prefers to push out her ultrasound to 6 months and check stability.  She expects that at that time surgery will be a more likely option.  Orders Orders Placed This Encounter  Procedures   US  PELVIS (TRANSABDOMINAL ONLY)   US  PELVIS TRANSVAGINAL NON-OB (TV ONLY)    No orders of the defined types were placed in this encounter.     F/U  Return in about 6 months (around 01/06/2025) for We will contact her with any abnormal test results.  Alm DOROTHA Sar, M.D. 07/06/2024 11:02 AM

## 2024-07-06 NOTE — Progress Notes (Signed)
 Patient presents today following recent ultrasound. She reports no vaginal bleeding and occasionally has a twinge near her ovary. No additional concerns.

## 2025-01-17 ENCOUNTER — Telehealth: Payer: Self-pay | Admitting: Obstetrics & Gynecology

## 2025-01-17 ENCOUNTER — Other Ambulatory Visit

## 2025-01-17 NOTE — Telephone Encounter (Signed)
 Reached out to pt about gyn US  that was scheduled on 01/17/2025 at 11:15.  Was able to reschedule the pt to 02/05/2024 at 10:15.  Dr. Janit ordered the US .

## 2025-02-04 ENCOUNTER — Other Ambulatory Visit

## 2025-02-04 DIAGNOSIS — N840 Polyp of corpus uteri: Secondary | ICD-10-CM
